# Patient Record
Sex: Male | Born: 1935 | Hispanic: Refuse to answer | Marital: Married | Smoking: Former smoker
Health system: Southern US, Community
[De-identification: ages and names within clinical notes are randomized; demographics above are authoritative.]

## PROBLEM LIST (undated history)

## (undated) DIAGNOSIS — Z8673 Personal history of transient ischemic attack (TIA), and cerebral infarction without residual deficits: Secondary | ICD-10-CM

## (undated) DIAGNOSIS — F0150 Vascular dementia without behavioral disturbance: Secondary | ICD-10-CM

## (undated) DIAGNOSIS — S51019A Laceration without foreign body of unspecified elbow, initial encounter: Secondary | ICD-10-CM

## (undated) DIAGNOSIS — I480 Paroxysmal atrial fibrillation: Secondary | ICD-10-CM

## (undated) DIAGNOSIS — I2510 Atherosclerotic heart disease of native coronary artery without angina pectoris: Secondary | ICD-10-CM

## (undated) DIAGNOSIS — I10 Essential (primary) hypertension: Secondary | ICD-10-CM

## (undated) DIAGNOSIS — S2239XA Fracture of one rib, unspecified side, initial encounter for closed fracture: Secondary | ICD-10-CM

---

## 2017-12-11 ENCOUNTER — Other Ambulatory Visit: Payer: Self-pay

## 2017-12-11 ENCOUNTER — Inpatient Hospital Stay (HOSPITAL_COMMUNITY)
Admission: AD | Admit: 2017-12-11 | Discharge: 2017-12-15 | DRG: 200 | Disposition: A | Discharge status: Skilled Nursing Facility | Payer: Medicare Other | Source: Other Acute Inpatient Hospital | Attending: Internal Medicine | Admitting: Internal Medicine

## 2017-12-11 ENCOUNTER — Encounter (HOSPITAL_COMMUNITY): Payer: Self-pay | Admitting: Obstetrics and Gynecology

## 2017-12-11 DIAGNOSIS — Y92009 Unspecified place in unspecified non-institutional (private) residence as the place of occurrence of the external cause: Secondary | ICD-10-CM | POA: Diagnosis not present

## 2017-12-11 DIAGNOSIS — Z8673 Personal history of transient ischemic attack (TIA), and cerebral infarction without residual deficits: Secondary | ICD-10-CM

## 2017-12-11 DIAGNOSIS — S51012A Laceration without foreign body of left elbow, initial encounter: Secondary | ICD-10-CM | POA: Diagnosis present

## 2017-12-11 DIAGNOSIS — Z880 Allergy status to penicillin: Secondary | ICD-10-CM

## 2017-12-11 DIAGNOSIS — I480 Paroxysmal atrial fibrillation: Secondary | ICD-10-CM | POA: Diagnosis present

## 2017-12-11 DIAGNOSIS — F0150 Vascular dementia without behavioral disturbance: Secondary | ICD-10-CM

## 2017-12-11 DIAGNOSIS — S2242XA Multiple fractures of ribs, left side, initial encounter for closed fracture: Secondary | ICD-10-CM | POA: Diagnosis present

## 2017-12-11 DIAGNOSIS — K2270 Barrett's esophagus without dysplasia: Secondary | ICD-10-CM | POA: Diagnosis present

## 2017-12-11 DIAGNOSIS — S2239XA Fracture of one rib, unspecified side, initial encounter for closed fracture: Secondary | ICD-10-CM

## 2017-12-11 DIAGNOSIS — Z87891 Personal history of nicotine dependence: Secondary | ICD-10-CM | POA: Diagnosis not present

## 2017-12-11 DIAGNOSIS — N179 Acute kidney failure, unspecified: Secondary | ICD-10-CM | POA: Diagnosis present

## 2017-12-11 DIAGNOSIS — Z888 Allergy status to other drugs, medicaments and biological substances status: Secondary | ICD-10-CM

## 2017-12-11 DIAGNOSIS — S2249XA Multiple fractures of ribs, unspecified side, initial encounter for closed fracture: Secondary | ICD-10-CM

## 2017-12-11 DIAGNOSIS — E876 Hypokalemia: Secondary | ICD-10-CM | POA: Diagnosis present

## 2017-12-11 DIAGNOSIS — R0781 Pleurodynia: Secondary | ICD-10-CM | POA: Diagnosis present

## 2017-12-11 DIAGNOSIS — Z955 Presence of coronary angioplasty implant and graft: Secondary | ICD-10-CM

## 2017-12-11 DIAGNOSIS — I10 Essential (primary) hypertension: Secondary | ICD-10-CM

## 2017-12-11 DIAGNOSIS — S271XXA Traumatic hemothorax, initial encounter: Principal | ICD-10-CM | POA: Diagnosis present

## 2017-12-11 DIAGNOSIS — E860 Dehydration: Secondary | ICD-10-CM | POA: Diagnosis present

## 2017-12-11 DIAGNOSIS — W19XXXA Unspecified fall, initial encounter: Secondary | ICD-10-CM | POA: Diagnosis present

## 2017-12-11 DIAGNOSIS — S51812A Laceration without foreign body of left forearm, initial encounter: Secondary | ICD-10-CM | POA: Diagnosis present

## 2017-12-11 DIAGNOSIS — I2510 Atherosclerotic heart disease of native coronary artery without angina pectoris: Secondary | ICD-10-CM

## 2017-12-11 DIAGNOSIS — J942 Hemothorax: Secondary | ICD-10-CM

## 2017-12-11 DIAGNOSIS — S51019A Laceration without foreign body of unspecified elbow, initial encounter: Secondary | ICD-10-CM

## 2017-12-11 DIAGNOSIS — K219 Gastro-esophageal reflux disease without esophagitis: Secondary | ICD-10-CM | POA: Diagnosis present

## 2017-12-11 DIAGNOSIS — R296 Repeated falls: Secondary | ICD-10-CM | POA: Diagnosis present

## 2017-12-11 HISTORY — DX: Paroxysmal atrial fibrillation: I48.0

## 2017-12-11 HISTORY — DX: Vascular dementia, unspecified severity, without behavioral disturbance, psychotic disturbance, mood disturbance, and anxiety: F01.50

## 2017-12-11 HISTORY — DX: Fracture of one rib, unspecified side, initial encounter for closed fracture: S22.39XA

## 2017-12-11 HISTORY — DX: Atherosclerotic heart disease of native coronary artery without angina pectoris: I25.10

## 2017-12-11 HISTORY — DX: Vascular dementia without behavioral disturbance: F01.50

## 2017-12-11 HISTORY — DX: Essential (primary) hypertension: I10

## 2017-12-11 HISTORY — DX: Personal history of transient ischemic attack (TIA), and cerebral infarction without residual deficits: Z86.73

## 2017-12-11 HISTORY — DX: Laceration without foreign body of unspecified elbow, initial encounter: S51.019A

## 2017-12-11 HISTORY — DX: Multiple fractures of ribs, unspecified side, initial encounter for closed fracture: S22.49XA

## 2017-12-11 LAB — MRSA PCR SCREENING: MRSA by PCR: NEGATIVE

## 2017-12-11 MED ORDER — PANTOPRAZOLE SODIUM 40 MG PO TBEC
40.0000 mg | DELAYED_RELEASE_TABLET | Freq: Two times a day (BID) | ORAL | Status: DC
  Administered 2017-12-11 – 2017-12-15 (×8): 40 mg via ORAL
  Filled 2017-12-11 (×8): qty 1

## 2017-12-11 MED ORDER — VITAMIN D 1000 UNITS PO TABS
2000.0000 [IU] | ORAL_TABLET | Freq: Every day | ORAL | Status: DC
  Administered 2017-12-12 – 2017-12-15 (×4): 2000 [IU] via ORAL
  Filled 2017-12-11 (×5): qty 2

## 2017-12-11 MED ORDER — FOLIC ACID 1 MG PO TABS
1.0000 mg | ORAL_TABLET | Freq: Every day | ORAL | Status: DC
  Administered 2017-12-12 – 2017-12-15 (×4): 1 mg via ORAL
  Filled 2017-12-11 (×4): qty 1

## 2017-12-11 MED ORDER — ACETAMINOPHEN 325 MG PO TABS
650.0000 mg | ORAL_TABLET | Freq: Four times a day (QID) | ORAL | Status: DC
  Administered 2017-12-11 – 2017-12-15 (×13): 650 mg via ORAL
  Filled 2017-12-11 (×13): qty 2

## 2017-12-11 MED ORDER — ACETAMINOPHEN 650 MG RE SUPP: 650.0000 mg | Freq: Four times a day (QID) | RECTAL | Status: DC | PRN

## 2017-12-11 MED ORDER — POTASSIUM CHLORIDE CRYS ER 10 MEQ PO TBCR
10.0000 meq | EXTENDED_RELEASE_TABLET | Freq: Two times a day (BID) | ORAL | Status: DC
  Administered 2017-12-11 – 2017-12-15 (×8): 10 meq via ORAL
  Filled 2017-12-11 (×8): qty 1

## 2017-12-11 MED ORDER — TAB-A-VITE/IRON PO TABS
1.0000 | ORAL_TABLET | Freq: Every day | ORAL | Status: DC
  Administered 2017-12-12 – 2017-12-15 (×3): 1 via ORAL
  Filled 2017-12-11 (×4): qty 1

## 2017-12-11 MED ORDER — SODIUM CHLORIDE 0.9 % IV SOLN: 250.0000 mL | INTRAVENOUS | Status: DC | PRN

## 2017-12-11 MED ORDER — HYDROMORPHONE HCL 1 MG/ML IJ SOLN
0.5000 mg | INTRAMUSCULAR | Status: DC | PRN
  Administered 2017-12-13: 0.5 mg via INTRAVENOUS
  Filled 2017-12-11: qty 1

## 2017-12-11 MED ORDER — IBUPROFEN 200 MG PO TABS
600.0000 mg | ORAL_TABLET | Freq: Four times a day (QID) | ORAL | Status: DC | PRN
  Administered 2017-12-13: 600 mg via ORAL
  Filled 2017-12-11: qty 3

## 2017-12-11 MED ORDER — TRAMADOL HCL 50 MG PO TABS
50.0000 mg | ORAL_TABLET | Freq: Four times a day (QID) | ORAL | Status: DC | PRN
  Administered 2017-12-11 – 2017-12-12 (×2): 50 mg via ORAL
  Filled 2017-12-11 (×2): qty 1

## 2017-12-11 MED ORDER — ACETAMINOPHEN 325 MG PO TABS: 650.0000 mg | ORAL_TABLET | Freq: Four times a day (QID) | ORAL | Status: DC | PRN

## 2017-12-11 MED ORDER — SODIUM CHLORIDE 0.9% FLUSH: 3.0000 mL | INTRAVENOUS | Status: DC | PRN

## 2017-12-11 MED ORDER — SODIUM CHLORIDE 0.9% FLUSH
3.0000 mL | Freq: Two times a day (BID) | INTRAVENOUS | Status: DC
  Administered 2017-12-11 – 2017-12-13 (×4): 3 mL via INTRAVENOUS

## 2017-12-11 MED ORDER — CITALOPRAM HYDROBROMIDE 10 MG PO TABS
10.0000 mg | ORAL_TABLET | Freq: Every day | ORAL | Status: DC
  Administered 2017-12-12 – 2017-12-15 (×4): 10 mg via ORAL
  Filled 2017-12-11 (×4): qty 1

## 2017-12-11 NOTE — H&P (Signed)
History and Physical    Matthew JacksonWilliam Santy ZOX:[id number] DOB: 07/03/1930 DOA: 12/11/2017  PCP: No primary care provider on file.  Patient coming from: home by way of [place]   Chief Complaint: fall  HPI: Matthew JacksonWilliam Pacheco is a 84 y.o. male with medical history significant for vascular dementia (lives at home w/ wife), CVA (not recent), CAD (stented approximately 12 years ago), paroxysmal atrial fibrillation, barrett's esophagus, who presents with above.  Unwitnessed fall at home. Walks with a walker. Had fallen several weeks ago as well and had skin tears on left arm that were re-injured. Wife went to see immediately after, patient alert but confused, complaining of left lateral rib pain. No recent illnesses, has been in usual state of health, but is quite unstable on feet at baseline. Has not complained of fever or chest pain. Normal PO, no vomiting or diarrhea. History is from wife and son; patient has no complaints but unable to provide history.  ED Course: CT chest/abdomen/pelvis. Trauma surg consult. Vitals reportedly wnl and stable. Labs sig for k 3.3, gluc 148, normal hemoglobin. Ct head  Review of Systems: As per HPI otherwise 10 point review of systems negative.    Past Medical History:  Diagnosis Date  . AF (paroxysmal atrial fibrillation) (HCC) 12/11/2017  . Coronary artery disease 12/11/2017  . Essential hypertension 12/11/2017  . History of CVA in adulthood 12/11/2017  . Rib fractures 12/11/2017  . Skin tear of elbow without complication 12/11/2017  . Vascular dementia 12/11/2017     has no tobacco, alcohol, and drug history on file.  Allergies not on file  No family history on file.  Prior to Admission medications   Not on File    Physical Exam: Vitals:   12/11/17 1753  BP: (!) 149/56  Pulse: 62  Resp: 17  Temp: (!) 97.5 F (36.4 C)  TempSrc: Oral  SpO2: 90%  Weight: 78.5 kg  Height: 5\' 9"  (1.753 m)    Constitutional: No acute distress Head:  Atraumatic Eyes: Conjunctiva clear ENM: Moist mucous membranes. Poor dentition.  Neck: d Respiratory: Clear to auscultation bilaterally save for mild rales at both bases, no wheezing/rales/rhonchi. Decreased respiratory effort. No accessory muscle use. . Cardiovascular: Regular rate and rhythm. Mod systolic murmur. Abdomen: Non-tender, non-distended. No masses. No rebound or guarding. Positive bowel sounds. Musculoskeletal: No joint deformity upper and lower extremities. Normal ROM, no contractures. Normal muscle tone. Able to flex legs without pain Skin: superficial skin tear left forearm and elbow. Dressing on low back not removed. echymosis left lateral lower ribs Extremities: trace peripheral edema. Palpable peripheral pulses. Neurologic: Alert, moving all 4 extremities. Oriented to person, not to time or place Psychiatric: speech limited but fluent.   Labs on Admission: I have personally reviewed following labs and imaging studies  CBC: No results for input(s): WBC, NEUTROABS, HGB, HCT, MCV, PLT in the last 168 hours. Basic Metabolic Panel: No results for input(s): NA, K, CL, CO2, GLUCOSE, BUN, CREATININE, CALCIUM, MG, PHOS in the last 168 hours. GFR: CrCl cannot be calculated (No successful lab value found.). Liver Function Tests: No results for input(s): AST, ALT, ALKPHOS, BILITOT, PROT, ALBUMIN in the last 168 hours. No results for input(s): LIPASE, AMYLASE in the last 168 hours. No results for input(s): AMMONIA in the last 168 hours. Coagulation Profile: No results for input(s): INR, PROTIME in the last 168 hours. Cardiac Enzymes: No results for input(s): CKTOTAL, CKMB, CKMBINDEX, TROPONINI in the last 168 hours. BNP (last 3 results) No results  for input(s): PROBNP in the last 8760 hours. HbA1C: No results for input(s): HGBA1C in the last 72 hours. CBG: No results for input(s): GLUCAP in the last 168 hours. Lipid Profile: No results for input(s): CHOL, HDL, LDLCALC,  TRIG, CHOLHDL, LDLDIRECT in the last 72 hours. Thyroid Function Tests: No results for input(s): TSH, T4TOTAL, FREET4, T3FREE, THYROIDAB in the last 72 hours. Anemia Panel: No results for input(s): VITAMINB12, FOLATE, FERRITIN, TIBC, IRON, RETICCTPCT in the last 72 hours. Urine analysis: No results found for: COLORURINE, APPEARANCEUR, LABSPEC, PHURINE, GLUCOSEU, HGBUR, BILIRUBINUR, KETONESUR, PROTEINUR, UROBILINOGEN, NITRITE, LEUKOCYTESUR  Radiological Exams on Admission: No results found.  EKG: Independently reviewed. Frequent pvcs. nsr.  Assessment/Plan Principal Problem:   Rib fractures Active Problems:   Essential hypertension   Coronary artery disease   Vascular dementia   History of CVA in adulthood   AF (paroxysmal atrial fibrillation) (HCC)   Skin tear of elbow without complication   # Rib fractures - left ribs 7-12, partially displaced, with ct showing small L hemothorax vs hematoma. Breathing comfortably, hemodynamically stable. Not complaining of pain. Trauma surg consulted (dr. Andrey Campanile) and will see. - monitor o2 with vitals - pain control: tylenol standing, toradol prn for mod pain, dilaudid prn for severe pain - incentive spirometry - appreciate trauma surg recs - step-down for now  # Superficial skin lacerations - skin tears to left forearm and left elbow. I initially supposed dressing lower back was for pressure ulcer but family says from superficial laceration from today's fall.  - wound care, will need to more fully evaluate low back laceration  # HTN - here bp wnl, not on meds at home - ctm  # paroxysmal a-fib - nsr here. Not on a rate control agent. - holding aspirin plavix given possible acute bleed  # CAD - s/p stents ~2 years ago, on dapt - hold as above  # GERD - substitute pantoprazole  # hypokalemia - k 3.3 - cont home kcl - cmp in am, with mg  # vascular dementia   DVT prophylaxis: scds, holding pharmacologic given acute possible  bleeding Code Status: full code, confirmed w/ family  Family Communication: son raney Armijo [phone]  Disposition Plan: tbd  Consults called: trauma surg [facility]  Admission status: step-down    Silvano Bilis MD [facility] Pager [phone]  If 7PM-7AM, please contact night-coverage [email or web address] Password [facility]  12/11/2017, 7:24 PM

## 2017-12-11 NOTE — Plan of Care (Signed)
[facility] [place] [name] is a 84 year old male with past medical history significant for HTN, CAD, A. Fib, dementia; who presented after falling and tripping at home.  Patient found to have multiple rib fractures with signs of a small hematoma or hemothorax found on imaging.  Patient currently on medications of Plavix.  Vital signs noted to be stable.  Initial labs revealed WBC 12.6, hemoglobin 15, platelets 180, sodium 140, potassium 3.3, chloride 107, CO2 25, BUN 25, creatinine 0.86, glucose 148.  Dr. Derrell Pacheco of trauma surgery was consulted.  TR[facility] called to admit.  Accepted to a telemetry bed as observation.

## 2017-12-11 NOTE — Consult Note (Addendum)
Reason for Consult:fall, rib fxs Referring Physician: dr Trisha Mangle is an 84 y.o. male.  HPI: 84 year old Caucasian male with a past medical history for vascular dementia, remote history of CVA, coronary artery disease, paroxysmal atrial fibrillation fell this morning while at home with his wife.  It was an unwitnessed fall.  He walks generally with a walker.  He had fallen several weeks ago and had some skin tears on his left arm which had almost healed which reopened after the fall this morning.  He was initially taken to [facility] and evaluated with labs and CT imaging.  [facility] did not have beds available so we were contacted to accept patient in transfer.  On plavix  At outside hospital he was found to have some mild hypokalemia with potassium 3.3.  Blood glucose 148.  Otherwise labs unremarkable except for mild leukocytosis with a white count of 12.6.  CT lumbar spine showed no evidence of fracture to the thoracic or lumbar spine. CT chest abdomen pelvis with contrast showed nondisplaced partially displaced left seventh through 12th rib fractures; small left hemothorax versus extrapleural chest wall hematoma. CT C-spine showed no evidence of acute fracture but did have chronic changes and loss of normal lordosis.  CT head showed no acute abnormalities just chronic changes.  He complains of left anterior lateral rib pain.  He denies any neck pain, shortness of breath, abdominal pain, extremity pain other than the area where the skin tears are.  He denies any blurry vision.  He denies any trouble breathing.  He has been feeling well per the family without fever or poor oral intake.  Past Medical History:  Diagnosis Date  . AF (paroxysmal atrial fibrillation) (HCC) 12/11/2017  . Coronary artery disease 12/11/2017  . Essential hypertension 12/11/2017  . History of CVA in adulthood 12/11/2017  . Rib fractures 12/11/2017  . Skin tear of elbow without  complication 12/11/2017  . Vascular dementia 12/11/2017      No family history on file.  Social History:  reports that he quit smoking about 57 years ago. His smoking use included cigarettes. He has never used smokeless tobacco. He reports that he does not drink alcohol or use drugs.  Allergies:  Allergies  Allergen Reactions  . Penicillins   . Phenobarbital     Medications: I have reviewed the patient's current medications.  No results found for this or any previous visit (from the past 48 hour(s)).  No results found.  Review of Systems  Unable to perform ROS: Dementia   Blood pressure (!) 149/56, pulse 62, temperature (!) 97.5 F (36.4 C), temperature source Oral, resp. rate 17, height 5\' 9"  (1.753 m), weight 78.5 kg, SpO2 90 %. Physical Exam  Vitals reviewed. Constitutional: He appears well-developed and well-nourished. He is cooperative. No distress. Cervical collar and nasal cannula in place.  HENT:  Head: Normocephalic and atraumatic. Head is without raccoon's eyes, without Battle's sign, without abrasion, without contusion and without laceration.  Right Ear: Hearing, tympanic membrane, external ear and ear canal normal. No lacerations. No drainage or tenderness. No foreign bodies. Tympanic membrane is not perforated. No hemotympanum.  Left Ear: Hearing, tympanic membrane, external ear and ear canal normal. No lacerations. No drainage or tenderness. No foreign bodies. Tympanic membrane is not perforated. No hemotympanum.  Nose: Nose normal. No nose lacerations, sinus tenderness, nasal deformity or nasal septal hematoma. No epistaxis.  Mouth/Throat: Uvula is midline, oropharynx is clear and moist and mucous membranes  are normal. No lacerations.  Eyes: Pupils are equal, round, and reactive to light. Conjunctivae, EOM and lids are normal. No scleral icterus.  Neck: Trachea normal. Neck supple. No JVD present. No spinous process tenderness and no muscular tenderness present.  Carotid bruit is not present. No tracheal deviation present. No thyromegaly present.  Cardiovascular: Normal rate, regular rhythm, normal heart sounds, intact distal pulses and normal pulses.  Respiratory: Effort normal and breath sounds normal. No respiratory distress. He exhibits tenderness. He exhibits no bony tenderness, no laceration and no crepitus.    GI: Soft. Normal appearance. He exhibits no distension. Bowel sounds are decreased. There is no tenderness. There is no rigidity, no rebound, no guarding and no CVA tenderness.  Musculoskeletal: Normal range of motion. He exhibits no edema or tenderness.  Lymphadenopathy:    He has no cervical adenopathy.  Neurological: He is alert. He has normal strength. No cranial nerve deficit or sensory deficit. GCS eye subscore is 4. GCS verbal subscore is 4. GCS motor subscore is 6.  Awake, conversive; not oriented to year/month; oriented to person.   Skin: Skin is warm and dry. Abrasion and ecchymosis noted. He is not diaphoretic.  Various states of healing ecchymosis on b/l ue; some skin tears on L forearm/elbow  Psychiatric: He has a normal mood and affect. His speech is normal and behavior is normal. He exhibits abnormal recent memory.    Assessment/Plan: Status post fall Left rib fractures 7 through 12 Small left hemothorax Multiple skin tears Vascular dementia Hypertension History of coronary artery disease Remote history of CVA  Agree with stepdown status Repeat chest x-ray in morning Check labs in morning Pulmonary toilet- incentive spirometer, flutter valve.  I am not sure if his dementia will enable him to do incentive spirometer Analgesia for rib fractures-we will try to avoid opioids in order to prevent worsening mental status.  We will try scheduled Tylenol.  PRN ibuprofen  Hold oral anticoagulation. Can probably start chemical VTE prophylaxis in the morning SCDs PT-OT consult in morning  Mary SellaEric M. Andrey CampanileWilson, MD, FACS General,  Bariatric, & Minimally Invasive Surgery [facility]   Gaynelle Aduric Marvis Bakken 12/11/2017, 9:09 PM

## 2017-12-12 ENCOUNTER — Inpatient Hospital Stay (HOSPITAL_COMMUNITY): Payer: Medicare Other

## 2017-12-12 DIAGNOSIS — S2242XD Multiple fractures of ribs, left side, subsequent encounter for fracture with routine healing: Secondary | ICD-10-CM

## 2017-12-12 DIAGNOSIS — J942 Hemothorax: Secondary | ICD-10-CM

## 2017-12-12 LAB — CBC
HCT: 40.9 % (ref 39.0–52.0)
HEMOGLOBIN: 13.9 g/dL (ref 13.0–17.0)
MCH: 33.3 pg (ref 26.0–34.0)
MCHC: 34 g/dL (ref 30.0–36.0)
MCV: 98.1 fL (ref 78.0–100.0)
Platelets: 171 10*3/uL (ref 150–400)
RBC: 4.17 MIL/uL — ABNORMAL LOW (ref 4.22–5.81)
RDW: 13 % (ref 11.5–15.5)
WBC: 12.5 10*3/uL — AB (ref 4.0–10.5)

## 2017-12-12 LAB — COMPREHENSIVE METABOLIC PANEL
ALBUMIN: 3.2 g/dL — AB (ref 3.5–5.0)
ALK PHOS: 50 U/L (ref 38–126)
ALT: 20 U/L (ref 0–44)
ANION GAP: 5 (ref 5–15)
AST: 21 U/L (ref 15–41)
BILIRUBIN TOTAL: 1.5 mg/dL — AB (ref 0.3–1.2)
BUN: 30 mg/dL — ABNORMAL HIGH (ref 8–23)
CALCIUM: 9 mg/dL (ref 8.9–10.3)
CO2: 30 mmol/L (ref 22–32)
Chloride: 104 mmol/L (ref 98–111)
Creatinine, Ser: 1.25 mg/dL — ABNORMAL HIGH (ref 0.61–1.24)
GFR calc Af Amer: 59 mL/min — ABNORMAL LOW (ref >60)
GFR, EST NON AFRICAN AMERICAN: 51 mL/min — AB (ref >60)
GLUCOSE: 116 mg/dL — AB (ref 70–99)
Potassium: 4.2 mmol/L (ref 3.5–5.1)
Sodium: 139 mmol/L (ref 135–145)
TOTAL PROTEIN: 5.2 g/dL — AB (ref 6.5–8.1)

## 2017-12-12 LAB — URINALYSIS, ROUTINE W REFLEX MICROSCOPIC
BILIRUBIN URINE: NEGATIVE
Glucose, UA: NEGATIVE mg/dL
Hgb urine dipstick: NEGATIVE
KETONES UR: NEGATIVE mg/dL
Leukocytes, UA: NEGATIVE
NITRITE: NEGATIVE
PH: 5 (ref 5.0–8.0)
Protein, ur: NEGATIVE mg/dL
Specific Gravity, Urine: >1.046 — ABNORMAL HIGH (ref 1.005–1.030)

## 2017-12-12 LAB — PROTIME-INR
INR: 1.07
PROTHROMBIN TIME: 13.8 s (ref 11.4–15.2)

## 2017-12-12 MED ORDER — ENOXAPARIN SODIUM 40 MG/0.4ML ~~LOC~~ SOLN
40.0000 mg | SUBCUTANEOUS | Status: DC
  Administered 2017-12-12 – 2017-12-15 (×4): 40 mg via SUBCUTANEOUS
  Filled 2017-12-12 (×4): qty 0.4

## 2017-12-12 MED ORDER — SODIUM CHLORIDE 0.9 % IV BOLUS
500.0000 mL | Freq: Once | INTRAVENOUS | Status: AC
  Administered 2017-12-12: 500 mL via INTRAVENOUS

## 2017-12-12 MED ORDER — SODIUM CHLORIDE 0.9 % IV SOLN
250.0000 mL | INTRAVENOUS | Status: DC | PRN
  Administered 2017-12-12: 250 mL via INTRAVENOUS

## 2017-12-12 MED ORDER — BOOST PLUS PO LIQD
237.0000 mL | Freq: Three times a day (TID) | ORAL | Status: DC
  Administered 2017-12-12 – 2017-12-15 (×4): 237 mL via ORAL
  Filled 2017-12-12 (×13): qty 237

## 2017-12-12 NOTE — Evaluation (Signed)
Occupational Therapy Evaluation Patient Details Name: Matthew Pacheco MRN: [id number] DOB: [birth year] Today's Date: 12/12/2017    History of Present Illness 84 y.o. male admitted on 12/11/17 for fall with L rib fractures 7-12 and small L hemothorax vs hematima.  Pt also with superficial skin lacerations/tears on his left forearm and elbow.  Pt with other significant PMH of vascular dementia, CVA, essential HTN, CAD, and PAF.   Clinical Impression   PTA, pt was able to ambulate with RW without assistance and had assistance from wife and home care nurse for dressing and bathing tasks. He currently is limited by generalized weakness and rib pain. He requires mod assist to power up to standing with RW to attempt to utilize urinal, mod assist for LB ADL, and min assist for UB ADL. Pt's wife present and supportive during session but unsure of her ability to provide assistance at pt's current functional level. Pt would benefit from continued OT services while admitted to improve independence and safety with ADL and functional mobility. Recommend SNF level rehabilitation post-acute D/C to maximize functional participation in ADL. OT will continue to follow while admitted.     Follow Up Recommendations  SNF;Supervision/Assistance - 24 hour    Equipment Recommendations  Other (comment)(defer to next venue of care)    Recommendations for Other Services       Precautions / Restrictions Precautions Precautions: Fall Precaution Comments: recent h/o falls Restrictions Weight Bearing Restrictions: No      Mobility Bed Mobility Overal bed mobility: Needs Assistance Bed Mobility: Supine to Sit     Supine to sit: Min assist     General bed mobility comments: OOB in recliner on my arrival.   Transfers Overall transfer level: Needs assistance Equipment used: Rolling walker (2 wheeled) Transfers: Sit to/from Stand Sit to Stand: Mod assist         General transfer comment: Mod assist to  power up to standing with significant posterior lean.     Balance Overall balance assessment: Needs assistance Sitting-balance support: Feet supported;No upper extremity supported Sitting balance-Leahy Scale: Fair Sitting balance - Comments: seated at edge of chair Postural control: Posterior lean Standing balance support: Bilateral upper extremity supported;During functional activity Standing balance-Leahy Scale: Poor Standing balance comment: Mod assist wtih significant posterior lean in standing with BUE support.                            ADL either performed or assessed with clinical judgement   ADL Overall ADL's : Needs assistance/impaired Eating/Feeding: Sitting;Minimal assistance   Grooming: Sitting;Minimal assistance   Upper Body Bathing: Minimal assistance;Sitting   Lower Body Bathing: Moderate assistance;Sit to/from stand   Upper Body Dressing : Minimal assistance;Sitting   Lower Body Dressing: Moderate assistance;Sit to/from Market researcher Details (indicate cue type and reason): Able to complete power up to standing in preparation for ADL transfers with mod assist. Unable to progress with mobility due to posterior bias. Attempting to stand with assistance to urinate with RW.  Toileting- Clothing Manipulation and Hygiene: Maximal assistance;Sit to/from stand Toileting - Clothing Manipulation Details (indicate cue type and reason): wife and OT assisting pt to use urinal; unable to urinate       General ADL Comments: Pt limited by rib pain and generalized weakness. He reports "I'm stiff."     Vision Patient Visual Report: No change from baseline Vision Assessment?: No apparent visual deficits     Perception  Praxis      Pertinent Vitals/Pain Pain Assessment: Faces Faces Pain Scale: Hurts even more Pain Location: left side with mobility Pain Descriptors / Indicators: Guarding;Grimacing Pain Intervention(s): Limited activity within  patient's tolerance;Monitored during session;Repositioned     Hand Dominance     Extremity/Trunk Assessment Upper Extremity Assessment Upper Extremity Assessment: Generalized weakness   Lower Extremity Assessment Lower Extremity Assessment: Generalized weakness   Cervical / Trunk Assessment Cervical / Trunk Assessment: Kyphotic   Communication Communication Communication: No difficulties   Cognition Arousal/Alertness: Awake/alert Behavior During Therapy: WFL for tasks assessed/performed Overall Cognitive Status: History of cognitive impairments - at baseline                                 General Comments: History of vascular dementia. Oriented to self although reporting that he was "Matthew Pacheco." and wife corrected him that he is "Pacheco."   General Comments  VSS throughout session.     Exercises     Shoulder Instructions      Home Living Family/patient expects to be discharged to:: Private residence Living Arrangements: Spouse/significant other Available Help at Discharge: Family;Personal care attendant(wife but she also has memory deficits; nurse during the day) Type of Home: House             Bathroom Shower/Tub: Producer, television/film/videoWalk-in shower   Bathroom Toilet: Standard     Home Equipment: Shower seat   Additional Comments: Need to confirm if walk-in shower.       Prior Functioning/Environment Level of Independence: Needs assistance  Gait / Transfers Assistance Needed: Ambulates with RW without assistance.  ADL's / Homemaking Assistance Needed: Requires assistance from wife or nurse for ADL tasks.    Comments: Has nurse that comes in during the day but only wife present at night.         OT Problem List: Decreased strength;Decreased range of motion;Decreased activity tolerance;Impaired balance (sitting and/or standing);Decreased safety awareness;Decreased knowledge of use of DME or AE;Decreased knowledge of precautions;Pain;Decreased cognition      OT  Treatment/Interventions: Self-care/ADL training;Therapeutic exercise;Energy conservation;DME and/or AE instruction;Therapeutic activities;Patient/family education;Balance training;Cognitive remediation/compensation    OT Goals(Current goals can be found in the care plan section) Acute Rehab OT Goals Patient Stated Goal: unable to state OT Goal Formulation: With family(with wife) Time For Goal Achievement: 12/26/17 Potential to Achieve Goals: Fair ADL Goals Pt Will Perform Eating: with modified independence;sitting Pt Will Perform Grooming: with min assist;standing Pt Will Perform Upper Body Dressing: with modified independence;sitting Pt Will Perform Lower Body Dressing: with supervision;sit to/from stand Pt Will Transfer to Toilet: with min guard assist;ambulating;bedside commode Pt Will Perform Toileting - Clothing Manipulation and hygiene: with min guard assist;sit to/from stand  OT Frequency: Min 2X/week   Barriers to D/C:            Co-evaluation              AM-PAC PT "6 Clicks" Daily Activity     Outcome Measure Help from another person eating meals?: A Little Help from another person taking care of personal grooming?: A Little Help from another person toileting, which includes using toliet, bedpan, or urinal?: A Lot Help from another person bathing (including washing, rinsing, drying)?: A Lot Help from another person to put on and taking off regular upper body clothing?: A Little Help from another person to put on and taking off regular lower body clothing?: A Lot 6 Click Score: 15  End of Session Equipment Utilized During Treatment: Gait belt;Rolling walker Nurse Communication: Mobility status  Activity Tolerance: Patient tolerated treatment well Patient left: in chair;with call bell/phone within reach;with bed alarm set;with family/visitor present  OT Visit Diagnosis: Other abnormalities of gait and mobility (R26.89);Other symptoms and signs involving cognitive  function;Muscle weakness (generalized) (M62.81)                Time: 1610-9604 OT Time Calculation (min): 28 min Charges:  OT General Charges $OT Visit: 1 Visit OT Evaluation $OT Eval Moderate Complexity: 1 Mod OT Treatments $Self Care/Home Management : 8-22 mins  Doristine Section, MS OTR/L  Pager: [phone]   Kennya Schwenn A Luby Seamans 12/12/2017, 4:37 PM

## 2017-12-12 NOTE — Evaluation (Signed)
Physical Therapy Evaluation Patient Details Name: Matthew Pacheco MRN: [id number] DOB: 1930/07/25 Today's Date: 12/12/2017   History of Present Illness  84 y.o. male admitted on 12/11/17 for fall with L rib fractures 7-12 and small L hemothorax vs hematima.  Pt also with superficial skin lacerations/tears on his left forearm and elbow.  Pt with other significant PMH of vascular dementia, CVA, essential HTN, CAD, and PAF.  Clinical Impression  It took significant effort to stand and take pivotal steps with the RW to the chair.  Pt with significant posterior lean throughout and needed mod assist with RW.  O2 sats were stable on RA during the session, but I returned [name] O2 to his nose at the end of the session for safety.  Pt would benefit from post acute rehab at discharge.    Follow Up Recommendations SNF    Equipment Recommendations  None recommended by PT    Recommendations for Other Services   NA    Precautions / Restrictions Precautions Precautions: Fall Precaution Comments: recent h/o falls      Mobility  Bed Mobility Overal bed mobility: Needs Assistance Bed Mobility: Supine to Sit     Supine to sit: Min assist     General bed mobility comments: Min assist to support trunk during transitions and help to progress bil legs over EOB.   Transfers Overall transfer level: Needs assistance Equipment used: Rolling walker (2 wheeled) Transfers: Sit to/from Stand Sit to Stand: Mod assist;From elevated surface         General transfer comment: Mod assist to support trunk to power up to stand from elevated bed.  Pt with posterior lean in standing.   Ambulation/Gait Ambulation/Gait assistance: Mod assist Gait Distance (Feet): 3 Feet Assistive device: Rolling walker (2 wheeled) Gait Pattern/deviations: Step-to pattern;Shuffle;Leaning posteriorly     General Gait Details: Pt leaning posteriorly throughout attempts at gait to the chair just beside the bed.  He took a few  pivotal steps and then "plopped" into the chair falling backwards.          Balance Overall balance assessment: Needs assistance Sitting-balance support: Feet supported;No upper extremity supported Sitting balance-Leahy Scale: Poor Sitting balance - Comments: min assist EOB Postural control: Posterior lean Standing balance support: Bilateral upper extremity supported Standing balance-Leahy Scale: Poor Standing balance comment: mod assist with RW.                              Pertinent Vitals/Pain Pain Assessment: Faces Faces Pain Scale: Hurts even more Pain Location: left side Pain Descriptors / Indicators: Guarding;Grimacing Pain Intervention(s): Limited activity within patient's tolerance;Monitored during session;Repositioned    Home Living Family/patient expects to be discharged to:: Private residence Living Arrangements: Spouse/significant other               Additional Comments: pt unable to provide accurate hx    Prior Function Level of Independence: Independent with assistive device(s)         Comments: per chart pt ambulated with RW, but has also been falling a lot recently.         Extremity/Trunk Assessment   Upper Extremity Assessment Upper Extremity Assessment: Defer to OT evaluation    Lower Extremity Assessment Lower Extremity Assessment: Generalized weakness    Cervical / Trunk Assessment Cervical / Trunk Assessment: Kyphotic  Communication   Communication: No difficulties  Cognition Arousal/Alertness: Awake/alert Behavior During Therapy: WFL for tasks assessed/performed Overall Cognitive Status: No  family/caregiver present to determine baseline cognitive functioning                                 General Comments: h/o vascular dementia, pleasantly confused.      General Comments General comments (skin integrity, edema, etc.): VSS throughout and pt was on RA during mobility.          Assessment/Plan     PT Assessment Patient needs continued PT services  PT Problem List Decreased activity tolerance;Decreased balance;Decreased mobility;Decreased cognition;Decreased safety awareness;Decreased knowledge of use of DME;Decreased coordination;Decreased knowledge of precautions;Pain       PT Treatment Interventions Gait training;DME instruction;Stair training;Functional mobility training;Therapeutic activities;Therapeutic exercise;Balance training;Neuromuscular re-education;Cognitive remediation;Patient/family education    PT Goals (Current goals can be found in the Care Plan section)  Acute Rehab PT Goals Patient Stated Goal: unable to state PT Goal Formulation: Patient unable to participate in goal setting Time For Goal Achievement: 12/26/17 Potential to Achieve Goals: Good    Frequency Min 2X/week   Barriers to discharge   Pt lives with an elderly wife and has recent history of frequent falls       AM-PAC PT "6 Clicks" Daily Activity  Outcome Measure Difficulty turning over in bed (including adjusting bedclothes, sheets and blankets)?: Unable Difficulty moving from lying on back to sitting on the side of the bed? : Unable Difficulty sitting down on and standing up from a chair with arms (e.g., wheelchair, bedside commode, etc,.)?: Unable Help needed moving to and from a bed to chair (including a wheelchair)?: A Lot Help needed walking in hospital room?: A Lot Help needed climbing 3-5 steps with a railing? : Total 6 Click Score: 8    End of Session Equipment Utilized During Treatment: Gait belt Activity Tolerance: Patient limited by pain Patient left: in chair;with call bell/phone within reach;with chair alarm set Nurse Communication: Mobility status PT Visit Diagnosis: Muscle weakness (generalized) (M62.81);Repeated falls (R29.6);Difficulty in walking, not elsewhere classified (R26.2)    Time: [id number] PT Time Calculation (min) (ACUTE ONLY): 14 min   Charges:           Lurena Joinerebecca B. Ieshia Hatcher, PT, DPT [phone]   PT Evaluation $PT Eval Moderate Complexity: 1 Mod         12/12/2017, 4:00 PM

## 2017-12-12 NOTE — Progress Notes (Signed)
[facility] Progress Note     Subjective: CC:  Denies pain. No IS at bedside. Pleasantly demented.   Objective: Vital signs in last 24 hours: Temp:  [97.5 F (36.4 C)-98.5 F (36.9 C)] 98.5 F (36.9 C) (08/24 0831) Pulse Rate:  [31-75] 31 (08/24 0400) Resp:  [15-18] 15 (08/24 0400) BP: (120-149)/(47-95) 120/47 (08/24 0400) SpO2:  [90 %-95 %] 95 % (08/24 0400) Weight:  [78.5 kg] 78.5 kg (08/23 1753) Last BM Date: 12/11/17  Intake/Output from previous day: 08/23 0701 - 08/24 0700 In: 90 [P.O.:90] Out: -  Intake/Output this shift: Total I/O In: 240 [P.O.:240] Out: -   PE: Gen:  Alert, NAD, pleasant Card:  Regular rate and rhythm, pedal pulses 2+ BL Pulm:  Normal effort, approp tender chest wall, clear to auscultation bilaterally Abd: Soft, non-tender, non-distended LUE: 2 skin tears, clean and dry, wrapped in bandage Skin: warm and dry, no rashes  Psych: A&Ox3   Lab Results:  Recent Labs    12/12/17 0307  WBC 12.5*  HGB 13.9  HCT 40.9  PLT 171   BMET Recent Labs    12/12/17 0307  NA 139  K 4.2  CL 104  CO2 30  GLUCOSE 116*  BUN 30*  CREATININE 1.25*  CALCIUM 9.0   PT/INR Recent Labs    12/12/17 0307  LABPROT 13.8  INR 1.07   CMP     Component Value Date/Time   NA 139 12/12/2017 0307   K 4.2 12/12/2017 0307   CL 104 12/12/2017 0307   CO2 30 12/12/2017 0307   GLUCOSE 116 (H) 12/12/2017 0307   BUN 30 (H) 12/12/2017 0307   CREATININE 1.25 (H) 12/12/2017 0307   CALCIUM 9.0 12/12/2017 0307   PROT 5.2 (L) 12/12/2017 0307   ALBUMIN 3.2 (L) 12/12/2017 0307   AST 21 12/12/2017 0307   ALT 20 12/12/2017 0307   ALKPHOS 50 12/12/2017 0307   BILITOT 1.5 (H) 12/12/2017 0307   GFRNONAA 51 (L) 12/12/2017 0307   GFRAA 59 (L) 12/12/2017 0307   Lipase  No results found for: LIPASE     Studies/Results: Dg Chest Port 1 View  Result Date: 12/12/2017 CLINICAL DATA:  Fall.  LEFT rib fractures. EXAM: PORTABLE CHEST 1 VIEW COMPARISON:   Outside studies are not available for comparison. FINDINGS: The cardiomediastinal silhouette is unremarkable. This is a mildly low volume film with mild bibasilar atelectasis. The known LEFT rib fractures are not well visualized on this radiograph. No pneumothorax or definite pleural effusion. IMPRESSION: Mildly low volume film with mild bibasilar atelectasis. Known LEFT rib fractures not well visualized on this radiograph. Electronically Signed   By: Harmon PierJeffrey  Hu M.D.   On: 12/12/2017 07:13    Anti-infectives: Anti-infectives (From admission, onward)   None     Assessment/Plan PMH CVA CAD on plavix and ASA - continue to hold anticoagulation  Paroxysmal a.fib  GERD Fall L Rib FX 7-12 - pain control, IS/pulm toilet  Small L HTX vs extrapleural hematoma - no PTX on follow up CXR  FEN - Reg diet, hypokalemia improved  ID - none VTE - SCD's, start lovenox today  Dispo - SDU    LOS: 1 day    Hosie SpangleElizabeth Simaan, [facility] Pager: [phone]

## 2017-12-12 NOTE — Progress Notes (Signed)
Initial Nutrition Assessment  DOCUMENTATION CODES:  Not applicable  INTERVENTION:  Continue Boost Supplements and MVI  Add Magic cup TID with meals, each supplement provides 290 kcal and 9 grams of protein  NUTRITION DIAGNOSIS:  Increased nutrient needs related to Fracture healing as evidenced by estimated nutritional requirements for this condition  GOAL:  Patient will meet greater than or equal to 90% of their needs  MONITOR:  PO intake, Supplement acceptance, Labs, Weight trends, Skin  REASON FOR ASSESSMENT:  Consult Assessment of nutrition requirement/status  ASSESSMENT:  84 y/o male PMHx vascular dementia, CVA, CAD, Afib, barretts esophagus, HTN, GERD. Presents after falling at home. Fractured ribs 7-12 and suffered small L hemothorax vs hematoma.   Pacheco consulted to assess nutritional status. Pt is demented and unable to offer any history. Wife is at bedside, but she also displays quite noticeable cognitive deficits/confusion as she repeatedly contradicts herself in consecutive sentences. Unable to discern exactly how patient was eating at home.   Per records, pt was weighed at 177 lbs a couple years ago. Presently, he was admitted at 173 lbs, suggesting he has been successfully at maintaining weight. Spouse did not he drinks Ensure at home and takes a vitamin.   At this time, nursing reports patient not eating well. He is fairly lethargic and only briefly opens eyes to verbal cueing. He already has Boost, MVI, folate ordered.   Physical Exam is unremarkable. No apparent muscle/fat wasting.   He is already on liberal diet. Suspect patients intake will improve as becomes more alert. Will add [employer] Cup.   Labs: Bg: 116, BUN/Creat:30/1.25, Albumin:3.2, WBC:12.5 Meds: MVI with min, KCL, PPI, Boost BID, Folate, IVF, PRN ultram.    Recent Labs  Lab 12/12/17 0307  NA 139  K 4.2  CL 104  CO2 30  BUN 30*  CREATININE 1.25*  CALCIUM 9.0  GLUCOSE 116*   NUTRITION - FOCUSED  PHYSICAL EXAM:   Most Recent Value  Orbital Region  Mild depletion  Upper Arm Region  No depletion  Thoracic and Lumbar Region  No depletion  Buccal Region  No depletion  Temple Region  No depletion  Clavicle Bone Region  No depletion  Clavicle and Acromion Bone Region  No depletion  Scapular Bone Region  No depletion  Dorsal Hand  No depletion  Patellar Region  No depletion  Anterior Thigh Region  No depletion  Posterior Calf Region  No depletion  Edema (Pacheco Assessment)  None  Hair  Reviewed  Eyes  Reviewed  Mouth  Reviewed  Skin  Reviewed  Nails  Reviewed     Diet Order:   Diet Order            Diet regular Room service appropriate? Yes; Fluid consistency: Thin  Diet effective now             EDUCATION NEEDS:  No education needs have been identified at this time  Skin: Skin tear to L arm  Last BM:  8/23  Height:  Ht Readings from Last 1 Encounters:  12/11/17 5\' 9"  (1.753 m)   Weight:  Wt Readings from Last 1 Encounters:  12/11/17 78.5 kg  Per Care Everywhere: Was 176 lbs 02/2016  Ideal Body Weight:  72.73 kg  BMI:  Body mass index is 25.56 kg/m.  Estimated Nutritional Needs:  Kcal:  1950-2100 kcals (25-27 kcal/kg bw) Protein:  94-110g Pro (1.2-1.4g/kg bw) Fluid:  >2L fluid (25 ml/kg bw)  Matthew Pacheco, LDN, CNSC Clinical Nutrition  Available Tues-Sat via Pager: [phone] 12/12/2017 3:07 PM

## 2017-12-12 NOTE — Progress Notes (Signed)
PROGRESS NOTE  Matthew Pacheco ZOX:[id number] DOB: March 12, 1931 DOA: 12/11/2017 PCP: No primary care provider on file.   LOS: 1 day   Brief Narrative / Interim history: Matthew Pacheco is a 84 y.o. male with medical history significant for vascular dementia (lives at home w/ wife), CVA (not recent), CAD (stented approximately 12 years ago), paroxysmal atrial fibrillation, barrett's esophagus, who presents after an unwitnessed fall at home, found to have multiple rib fractures.  He was admitted to the hospital and trauma surgery was consulted.  Assessment & Plan: Principal Problem:   Rib fractures Active Problems:   Essential hypertension   Coronary artery disease   Vascular dementia   History of CVA in adulthood   AF (paroxysmal atrial fibrillation) (HCC)   Skin tear of elbow without complication   Rib fractures in the setting of recurrent falls -Left ribs 7-12, partially displaced, with CT showing small left hemothorax versus hematoma.  He has remained hemodynamically stable, chest x-ray this morning without significant changes -Appreciate trauma surgery recommendations -Check a UA to rule out a UTI  Superficial skin lacerations -Wound care  Hypertension -Remains within normal parameters  Paroxysmal A. fib -Normal sinus here, hold aspirin and Plavix.  Not a candidate for anticoagulation right now with concern for hemothorax as well as recurrent falls  Coronary artery disease -No chest pain, this appears stable  Vascular dementia -Stable  Dispo -Awaiting PT/OT evaluation, will likely need SNF   DVT prophylaxis: Lovenox Code Status: Full code Family Communication: No family present at bedside Disposition Plan: TBD  Consultants:   Trauma surgery  Procedures:   None   Antimicrobials:  None    Subjective: -Pleasantly demented, no complaints.  Denies any pain.  Denies any shortness of breath.  No abdominal discomfort  Objective: Vitals:   12/11/17 2240  12/12/17 0000 12/12/17 0400 12/12/17 0831  BP: 136/63 (!) 127/95 (!) 120/47   Pulse: 75 71 (!) 31   Resp: 18 16 15    Temp: 98.3 F (36.8 C) 98.1 F (36.7 C) 98.1 F (36.7 C) 98.5 F (36.9 C)  TempSrc: Oral  Oral Oral  SpO2: 93% 91% 95%   Weight:      Height:        Intake/Output Summary (Last 24 hours) at 12/12/2017 1114 Last data filed at 12/12/2017 0930 Gross per 24 hour  Intake 330 ml  Output -  Net 330 ml   Filed Weights   12/11/17 1753  Weight: 78.5 kg    Examination:  Constitutional: NAD Eyes: lids and conjunctivae normal ENMT: Mucous membranes are moist Neck: normal, supple Respiratory: clear to auscultation bilaterally, no wheezing, no crackles. Normal respiratory effort.  Shallow breathing Cardiovascular: Regular rate and rhythm, no murmurs / rubs / gallops. No LE edema.  Abdomen: no tenderness. Bowel sounds positive.  Neurologic: Nonfocal  Data Reviewed: I have independently reviewed following labs and imaging studies   CBC: Recent Labs  Lab 12/12/17 0307  WBC 12.5*  HGB 13.9  HCT 40.9  MCV 98.1  PLT 171   Basic Metabolic Panel: Recent Labs  Lab 12/12/17 0307  NA 139  K 4.2  CL 104  CO2 30  GLUCOSE 116*  BUN 30*  CREATININE 1.25*  CALCIUM 9.0   GFR: Estimated Creatinine Clearance: 44 mL/min (A) (by C-G formula based on SCr of 1.25 mg/dL (H)). Liver Function Tests: Recent Labs  Lab 12/12/17 0307  AST 21  ALT 20  ALKPHOS 50  BILITOT 1.5*  PROT 5.2*  ALBUMIN 3.2*  No results for input(s): LIPASE, AMYLASE in the last 168 hours. No results for input(s): AMMONIA in the last 168 hours. Coagulation Profile: Recent Labs  Lab 12/12/17 0307  INR 1.07   Cardiac Enzymes: No results for input(s): CKTOTAL, CKMB, CKMBINDEX, TROPONINI in the last 168 hours. BNP (last 3 results) No results for input(s): PROBNP in the last 8760 hours. HbA1C: No results for input(s): HGBA1C in the last 72 hours. CBG: No results for input(s): GLUCAP in  the last 168 hours. Lipid Profile: No results for input(s): CHOL, HDL, LDLCALC, TRIG, CHOLHDL, LDLDIRECT in the last 72 hours. Thyroid Function Tests: No results for input(s): TSH, T4TOTAL, FREET4, T3FREE, THYROIDAB in the last 72 hours. Anemia Panel: No results for input(s): VITAMINB12, FOLATE, FERRITIN, TIBC, IRON, RETICCTPCT in the last 72 hours. Urine analysis: No results found for: COLORURINE, APPEARANCEUR, LABSPEC, PHURINE, GLUCOSEU, HGBUR, BILIRUBINUR, KETONESUR, PROTEINUR, UROBILINOGEN, NITRITE, LEUKOCYTESUR Sepsis Labs: Invalid input(s): PROCALCITONIN, LACTICIDVEN  Recent Results (from the past 240 hour(s))  MRSA PCR Screening     Status: None   Collection Time: 12/11/17  8:07 PM  Result Value Ref Range Status   MRSA by PCR NEGATIVE NEGATIVE Final    Comment:        The GeneXpert MRSA Assay (FDA approved for NASAL specimens only), is one component of a comprehensive MRSA colonization surveillance program. It is not intended to diagnose MRSA infection nor to guide or monitor treatment for MRSA infections. Performed at [facility], [address] [place], [place] [address]       Radiology Studies: Dg Chest Port 1 View  Result Date: 12/12/2017 CLINICAL DATA:  Fall.  LEFT rib fractures. EXAM: PORTABLE CHEST 1 VIEW COMPARISON:  Outside studies are not available for comparison. FINDINGS: The cardiomediastinal silhouette is unremarkable. This is a mildly low volume film with mild bibasilar atelectasis. The known LEFT rib fractures are not well visualized on this radiograph. No pneumothorax or definite pleural effusion. IMPRESSION: Mildly low volume film with mild bibasilar atelectasis. Known LEFT rib fractures not well visualized on this radiograph. Electronically Signed   By: Harmon PierJeffrey  Hu M.D.   On: 12/12/2017 07:13     Scheduled Meds: . acetaminophen  650 mg Oral Q6H  . cholecalciferol  2,000 Units Oral Daily  . citalopram  10 mg Oral Daily  . enoxaparin  (LOVENOX) injection  40 mg Subcutaneous Q24H  . folic acid  1 mg Oral Daily  . lactose free nutrition  237 mL Oral TID WC  . multivitamins with iron  1 tablet Oral Daily  . pantoprazole  40 mg Oral BID  . potassium chloride  10 mEq Oral BID  . sodium chloride flush  3 mL Intravenous Q12H   Continuous Infusions: . sodium chloride 250 mL (12/12/17 1105)    Matthew Pertostin Chantille Navarrete, MD, PhD [facility] Pager [phone]  If 7PM-7AM, please contact night-coverage [email or web address] Password [email or web address] 12/12/2017, 11:14 AM

## 2017-12-13 LAB — CBC
HEMATOCRIT: 38.1 % — AB (ref 39.0–52.0)
Hemoglobin: 12.4 g/dL — ABNORMAL LOW (ref 13.0–17.0)
MCH: 32.4 pg (ref 26.0–34.0)
MCHC: 32.5 g/dL (ref 30.0–36.0)
MCV: 99.5 fL (ref 78.0–100.0)
Platelets: 152 10*3/uL (ref 150–400)
RBC: 3.83 MIL/uL — AB (ref 4.22–5.81)
RDW: 13.1 % (ref 11.5–15.5)
WBC: 12.1 10*3/uL — AB (ref 4.0–10.5)

## 2017-12-13 LAB — BASIC METABOLIC PANEL
ANION GAP: 8 (ref 5–15)
BUN: 39 mg/dL — ABNORMAL HIGH (ref 8–23)
CO2: 25 mmol/L (ref 22–32)
Calcium: 8.9 mg/dL (ref 8.9–10.3)
Chloride: 105 mmol/L (ref 98–111)
Creatinine, Ser: 1.44 mg/dL — ABNORMAL HIGH (ref 0.61–1.24)
GFR, EST AFRICAN AMERICAN: 50 mL/min — AB (ref >60)
GFR, EST NON AFRICAN AMERICAN: 43 mL/min — AB (ref >60)
Glucose, Bld: 109 mg/dL — ABNORMAL HIGH (ref 70–99)
POTASSIUM: 4 mmol/L (ref 3.5–5.1)
Sodium: 138 mmol/L (ref 135–145)

## 2017-12-13 MED ORDER — SODIUM CHLORIDE 0.9 % IV SOLN
INTRAVENOUS | Status: AC
  Administered 2017-12-13 (×2): via INTRAVENOUS

## 2017-12-13 MED ORDER — SODIUM CHLORIDE 0.9 % IV SOLN: 250.0000 mL | INTRAVENOUS | Status: DC | PRN

## 2017-12-13 NOTE — Progress Notes (Signed)
PROGRESS NOTE  Matthew Pacheco ZOX:[id number] DOB: December 01, 1930 DOA: 12/11/2017 PCP: No primary care provider on file.   LOS: 2 days   Brief Narrative / Interim history: Matthew Pacheco is a 84 y.o. male with medical history significant for vascular dementia (lives at home w/ wife), CVA (not recent), CAD (stented approximately 12 years ago), paroxysmal atrial fibrillation, barrett's esophagus, who presents after an unwitnessed fall at home, found to have multiple rib fractures.  He was admitted to the hospital and trauma surgery was consulted.  Assessment & Plan: Principal Problem:   Rib fractures Active Problems:   Essential hypertension   Coronary artery disease   Vascular dementia   History of CVA in adulthood   AF (paroxysmal atrial fibrillation) (HCC)   Skin tear of elbow without complication   Rib fractures in the setting of recurrent falls -Left ribs 7-12, partially displaced, with CT showing small left hemothorax versus hematoma.  He has remained hemodynamically stable, chest x-ray this morning without significant changes -Appreciate trauma surgery recommendations -Physical therapy recommending SNF to which family is agreeable.  Consult SW  Superficial skin lacerations -Wound care  Hypertension -Remains within normal parameters  Paroxysmal A. fib -Normal sinus here, hold aspirin and Plavix.  Not a candidate for anticoagulation right now with concern for hemothorax as well as recurrent falls -Trigeminy/PVCs on the monitor, asymptomatic  Coronary artery disease -No chest pain, this appears stable  Acute kidney injury -Present on admission, likely in the setting of poor p.o. intake, continue IV fluids and increased rates today for slightly worsening creatinine  Vascular dementia -Stable  Dispo -Awaiting PT/OT evaluation, will likely need SNF   DVT prophylaxis: Lovenox Code Status: Full code Family Communication: No family present at bedside Disposition Plan:  TBD  Consultants:   Trauma surgery  Procedures:   None   Antimicrobials:  None    Subjective: -No complaints this morning, denies pain, denies shortness of breath.  No nausea or vomiting.  Objective: Vitals:   12/13/17 0301 12/13/17 0400 12/13/17 0701 12/13/17 0800  BP: (!) 145/76 (!) 173/55  (!) 129/45  Pulse: 78 (!) 28  80  Resp: 18 18  15   Temp: 98.1 F (36.7 C) 97.8 F (36.6 C) (!) 97.3 F (36.3 C)   TempSrc: Oral Oral    SpO2: 95% 95%  99%  Weight:      Height:        Intake/Output Summary (Last 24 hours) at 12/13/2017 1039 Last data filed at 12/13/2017 0500 Gross per 24 hour  Intake 1185.84 ml  Output 560 ml  Net 625.84 ml   Filed Weights   12/11/17 1753  Weight: 78.5 kg    Examination:  Constitutional: NAD Respiratory: CTA biL Cardiovascular: RRR  Data Reviewed: I have independently reviewed following labs and imaging studies   CBC: Recent Labs  Lab 12/12/17 0307 12/13/17 0301  WBC 12.5* 12.1*  HGB 13.9 12.4*  HCT 40.9 38.1*  MCV 98.1 99.5  PLT 171 152   Basic Metabolic Panel: Recent Labs  Lab 12/12/17 0307 12/13/17 0301  NA 139 138  K 4.2 4.0  CL 104 105  CO2 30 25  GLUCOSE 116* 109*  BUN 30* 39*  CREATININE 1.25* 1.44*  CALCIUM 9.0 8.9   GFR: Estimated Creatinine Clearance: 38.2 mL/min (A) (by C-G formula based on SCr of 1.44 mg/dL (H)). Liver Function Tests: Recent Labs  Lab 12/12/17 0307  AST 21  ALT 20  ALKPHOS 50  BILITOT 1.5*  PROT 5.2*  ALBUMIN 3.2*   No results for input(s): LIPASE, AMYLASE in the last 168 hours. No results for input(s): AMMONIA in the last 168 hours. Coagulation Profile: Recent Labs  Lab 12/12/17 0307  INR 1.07   Cardiac Enzymes: No results for input(s): CKTOTAL, CKMB, CKMBINDEX, TROPONINI in the last 168 hours. BNP (last 3 results) No results for input(s): PROBNP in the last 8760 hours. HbA1C: No results for input(s): HGBA1C in the last 72 hours. CBG: No results for input(s):  GLUCAP in the last 168 hours. Lipid Profile: No results for input(s): CHOL, HDL, LDLCALC, TRIG, CHOLHDL, LDLDIRECT in the last 72 hours. Thyroid Function Tests: No results for input(s): TSH, T4TOTAL, FREET4, T3FREE, THYROIDAB in the last 72 hours. Anemia Panel: No results for input(s): VITAMINB12, FOLATE, FERRITIN, TIBC, IRON, RETICCTPCT in the last 72 hours. Urine analysis:    Component Value Date/Time   COLORURINE AMBER (A) 12/12/2017 2320   APPEARANCEUR HAZY (A) 12/12/2017 2320   LABSPEC >1.046 (H) 12/12/2017 2320   PHURINE 5.0 12/12/2017 2320   GLUCOSEU NEGATIVE 12/12/2017 2320   HGBUR NEGATIVE 12/12/2017 2320   BILIRUBINUR NEGATIVE 12/12/2017 2320   KETONESUR NEGATIVE 12/12/2017 2320   PROTEINUR NEGATIVE 12/12/2017 2320   NITRITE NEGATIVE 12/12/2017 2320   LEUKOCYTESUR NEGATIVE 12/12/2017 2320   Sepsis Labs: Invalid input(s): PROCALCITONIN, LACTICIDVEN  Recent Results (from the past 240 hour(s))  MRSA PCR Screening     Status: None   Collection Time: 12/11/17  8:07 PM  Result Value Ref Range Status   MRSA by PCR NEGATIVE NEGATIVE Final    Comment:        The GeneXpert MRSA Assay (FDA approved for NASAL specimens only), is one component of a comprehensive MRSA colonization surveillance program. It is not intended to diagnose MRSA infection nor to guide or monitor treatment for MRSA infections. Performed at [facility], [address] [place], [place] [address]       Radiology Studies: Dg Chest Port 1 View  Result Date: 12/12/2017 CLINICAL DATA:  Fall.  LEFT rib fractures. EXAM: PORTABLE CHEST 1 VIEW COMPARISON:  Outside studies are not available for comparison. FINDINGS: The cardiomediastinal silhouette is unremarkable. This is a mildly low volume film with mild bibasilar atelectasis. The known LEFT rib fractures are not well visualized on this radiograph. No pneumothorax or definite pleural effusion. IMPRESSION: Mildly low volume film with mild bibasilar  atelectasis. Known LEFT rib fractures not well visualized on this radiograph. Electronically Signed   By: Harmon PierJeffrey  Hu M.D.   On: 12/12/2017 07:13     Scheduled Meds: . acetaminophen  650 mg Oral Q6H  . cholecalciferol  2,000 Units Oral Daily  . citalopram  10 mg Oral Daily  . enoxaparin (LOVENOX) injection  40 mg Subcutaneous Q24H  . folic acid  1 mg Oral Daily  . lactose free nutrition  237 mL Oral TID WC  . multivitamins with iron  1 tablet Oral Daily  . pantoprazole  40 mg Oral BID  . potassium chloride  10 mEq Oral BID   Continuous Infusions: . sodium chloride      Pamella Pertostin Nitzia Perren, MD, PhD [facility] Pager [phone]  If 7PM-7AM, please contact night-coverage [email or web address] Password [email or web address] 12/13/2017, 10:39 AM

## 2017-12-13 NOTE — Plan of Care (Signed)
Pt has very poor appetite today. Ate a small amount (less than 25% of his meals). Pt family brought patient's favorite dinner to help encourage pt to eat more but he refused.

## 2017-12-13 NOTE — Progress Notes (Addendum)
[facility] Progress Note     Subjective: CC:  Wife and son at bedside. Patient confused this morning, per family more confused than his baseline. Patient denies pain. Tolerating PO but has poor oral intake. Wife reports poor appetite at home and patient was recently started on low dose prednisone to help stimulate appetite. PT/OT recommending SNF. Family already has a preferred facility - [facility]. Their goal is to get him back home [facility].   Objective: Vital signs in last 24 hours: Temp:  [97.6 F (36.4 C)-98.5 F (36.9 C)] 97.8 F (36.6 C) (08/25 0400) Pulse Rate:  [28-78] 28 (08/25 0400) Resp:  [12-18] 18 (08/25 0400) BP: (117-173)/(44-76) 173/55 (08/25 0400) SpO2:  [95 %-98 %] 95 % (08/25 0400) FiO2 (%):  [2 %] 2 % (08/25 0400) Last BM Date: 12/11/17  Intake/Output from previous day: 08/24 0701 - 08/25 0700 In: 1425.8 [P.O.:540; I.V.:885.8] Out: 560 [Urine:560] Intake/Output this shift: No intake/output data recorded.  PE: Gen:  Alert, NAD, pleasant  Card:  Regular rate and rhythm, pedal pulses 2+ BL Pulm:  Normal effort, clear to auscultation bilaterally, 1250 cc on IS Abd: Soft, non-tender, non-distended, bowel sounds present  Skin: warm and dry, no rashes; L arm with bandages x 2 clean and dry Psych: oriented to self/family, not oriented to place or time.  Lab Results:  Recent Labs    12/12/17 0307 12/13/17 0301  WBC 12.5* 12.1*  HGB 13.9 12.4*  HCT 40.9 38.1*  PLT 171 152   BMET Recent Labs    12/12/17 0307 12/13/17 0301  NA 139 138  K 4.2 4.0  CL 104 105  CO2 30 25  GLUCOSE 116* 109*  BUN 30* 39*  CREATININE 1.25* 1.44*  CALCIUM 9.0 8.9   PT/INR Recent Labs    12/12/17 0307  LABPROT 13.8  INR 1.07   CMP     Component Value Date/Time   NA 138 12/13/2017 0301   K 4.0 12/13/2017 0301   CL 105 12/13/2017 0301   CO2 25 12/13/2017 0301   GLUCOSE 109 (H) 12/13/2017 0301   BUN 39 (H) 12/13/2017 0301    CREATININE 1.44 (H) 12/13/2017 0301   CALCIUM 8.9 12/13/2017 0301   PROT 5.2 (L) 12/12/2017 0307   ALBUMIN 3.2 (L) 12/12/2017 0307   AST 21 12/12/2017 0307   ALT 20 12/12/2017 0307   ALKPHOS 50 12/12/2017 0307   BILITOT 1.5 (H) 12/12/2017 0307   GFRNONAA 43 (L) 12/13/2017 0301   GFRAA 50 (L) 12/13/2017 0301   Lipase  No results found for: LIPASE     Studies/Results: Dg Chest Port 1 View  Result Date: 12/12/2017 CLINICAL DATA:  Fall.  LEFT rib fractures. EXAM: PORTABLE CHEST 1 VIEW COMPARISON:  Outside studies are not available for comparison. FINDINGS: The cardiomediastinal silhouette is unremarkable. This is a mildly low volume film with mild bibasilar atelectasis. The known LEFT rib fractures are not well visualized on this radiograph. No pneumothorax or definite pleural effusion. IMPRESSION: Mildly low volume film with mild bibasilar atelectasis. Known LEFT rib fractures not well visualized on this radiograph. Electronically Signed   By: Harmon PierJeffrey  Hu M.D.   On: 12/12/2017 07:13    Anti-infectives: Anti-infectives (From admission, onward)   None     Assessment/Plan PMH CVA CAD on plavix and ASA - continue to hold DAPT, spoke with MD, can resume upon discharge to SNF  Paroxysmal a.fib  GERD Fall L Rib FX 7-12 -  pain control, IS/pulm toilet  Small L HTX vs extrapleural hematoma - no PTX on follow up CXR 8/24  FEN - Reg diet, hypokalemia improved  ID - no abx,  Afebrile, WBC 12.1 from 12.4, UA negative VTE - SCD's, lovenox  Dispo - SDU, PT/OT recommending SNF     LOS: 2 days    Hosie Spangle, [facility] Pager: [phone]

## 2017-12-13 NOTE — NC FL2 (Signed)
[place] MEDICAID FL2 LEVEL OF CARE SCREENING TOOL     IDENTIFICATION  Patient Name: Matthew Pacheco Birthdate: 03/13/1931 Sex: male Admission Date (Current Location): 12/11/2017  [place] and [place] Number:  Janina Mayo)   Facility and Address:  [facility], [address], [place], [place] [address]      Provider Number: [id number]  Attending Physician Name and Address:  Leatha Gilding, MD  Relative Name and Phone Number:       Current Level of Care: Hospital Recommended Level of Care: Skilled Nursing Facility Prior Approval Number:    Date Approved/Denied:   PASRR Number: [id number]  Discharge Plan: SNF    Current Diagnoses: Patient Active Problem List   Diagnosis Date Noted  . Essential hypertension 12/11/2017  . Coronary artery disease 12/11/2017  . Vascular dementia 12/11/2017  . History of CVA in adulthood 12/11/2017  . AF (paroxysmal atrial fibrillation) (HCC) 12/11/2017  . Skin tear of elbow without complication 12/11/2017  . Rib fractures 12/11/2017    Orientation RESPIRATION BLADDER Height & Weight     Self  O2(2L [place]) Continent Weight: 173 lb 1 oz (78.5 kg) Height:  5\' 9"  (175.3 cm)  BEHAVIORAL SYMPTOMS/MOOD NEUROLOGICAL BOWEL NUTRITION STATUS      Continent Diet(regular)  AMBULATORY STATUS COMMUNICATION OF NEEDS Skin   Extensive Assist Verbally Normal                       Personal Care Assistance Level of Assistance  Bathing, Dressing Bathing Assistance: Maximum assistance   Dressing Assistance: Maximum assistance     Functional Limitations Info             SPECIAL CARE FACTORS FREQUENCY  PT (By licensed PT), OT (By licensed OT)     PT Frequency: 5/wk OT Frequency: 5/wk            Contractures      Additional Factors Info  Code Status, Allergies Code Status Info: FULL Allergies Info: Penicillins, Phenobarbital           Current Medications (12/13/2017):  This is the current hospital  active medication list Current Facility-Administered Medications  Medication Dose Route Frequency Provider Last Rate Last Dose  . 0.9 %  sodium chloride infusion   Intravenous Continuous Leatha Gilding, MD 100 mL/hr at 12/13/17 1201    . acetaminophen (TYLENOL) tablet 650 mg  650 mg Oral Q6H Gaynelle Adu, MD   650 mg at 12/13/17 1147  . cholecalciferol (VITAMIN D) tablet 2,000 Units  2,000 Units Oral Daily Audrea Muscat T, NP   2,000 Units at 12/13/17 1146  . citalopram (CELEXA) tablet 10 mg  10 mg Oral Daily Blount, Andi Devon T, NP   10 mg at 12/13/17 1147  . enoxaparin (LOVENOX) injection 40 mg  40 mg Subcutaneous Q24H Adam Phenix, PA-C   40 mg at 12/13/17 1148  . folic acid (FOLVITE) tablet 1 mg  1 mg Oral Daily Blount, Xenia T, NP   1 mg at 12/13/17 1146  . HYDROmorphone (DILAUDID) injection 0.5 mg  0.5 mg Intravenous Q3H PRN Wouk, Wilfred Curtis, MD      . ibuprofen (ADVIL,MOTRIN) tablet 600 mg  600 mg Oral Q6H PRN Gaynelle Adu, MD      . lactose free nutrition (BOOST PLUS) liquid 237 mL  237 mL Oral TID WC Leatha Gilding, MD   237 mL at 12/12/17 1731  . multivitamins with iron tablet 1 tablet  1  tablet Oral Daily Audrea MuscatBlount, Xenia T, NP   1 tablet at 12/12/17 1045  . pantoprazole (PROTONIX) EC tablet 40 mg  40 mg Oral BID Audrea MuscatBlount, Xenia T, NP   40 mg at 12/13/17 1147  . potassium chloride (K-DUR,KLOR-CON) CR tablet 10 mEq  10 mEq Oral BID Audrea MuscatBlount, Xenia T, NP   10 mEq at 12/13/17 1147  . traMADol (ULTRAM) tablet 50 mg  50 mg Oral Q6H PRN Kathrynn RunningWouk, Noah Bedford, MD   50 mg at 12/12/17 [id number]     Discharge Medications: Please see discharge summary for a list of discharge medications.  Relevant Imaging Results:  Relevant Lab Results:   Additional Information SS#: [id number]  Burna SisUris, Sharell Hilmer H, LCSW

## 2017-12-14 LAB — BASIC METABOLIC PANEL
Anion gap: 7 (ref 5–15)
BUN: 27 mg/dL — AB (ref 8–23)
CHLORIDE: 109 mmol/L (ref 98–111)
CO2: 22 mmol/L (ref 22–32)
CREATININE: 0.91 mg/dL (ref 0.61–1.24)
Calcium: 8.8 mg/dL — ABNORMAL LOW (ref 8.9–10.3)
GFR calc Af Amer: >60 mL/min (ref >60)
GFR calc non Af Amer: >60 mL/min (ref >60)
GLUCOSE: 148 mg/dL — AB (ref 70–99)
Potassium: 3.7 mmol/L (ref 3.5–5.1)
SODIUM: 138 mmol/L (ref 135–145)

## 2017-12-14 LAB — MAGNESIUM: MAGNESIUM: 2.1 mg/dL (ref 1.7–2.4)

## 2017-12-14 NOTE — Clinical Social Work Note (Signed)
Clinical Social Work Assessment  Patient Details  Name: Matthew Pacheco MRN: [id number] Date of Birth: 12-21-1930  Date of referral:  12/14/17               Reason for consult:  Facility Placement, Discharge Planning                Permission sought to share information with:  Family Supports, Customer service manager Permission granted to share information::  Yes, Verbal Permission Granted  Name::     [employer].   Agency::  [facility]  Relationship::  son  Contact Information:  [phone]  Housing/Transportation Living arrangements for the past 2 months:  [place] of Information:  Adult Children Patient Interpreter Needed:  None Criminal Activity/Legal Involvement Pertinent to Current Situation/Hospitalization:  No - Comment as needed Significant Relationships:  Adult Children Lives with:  Spouse Do you feel safe going back to the place where you live?  Yes Need for family participation in patient care:  Yes (Comment)  Care giving concerns:  Pt has dementia, has had multiple falls and is requiring more assistance than can currently be handled at home.    Social Worker assessment / plan:  CSW met with pt and pt son, pt disoriented so pt son completed assessment. Pt lives at home with his wife who according to RN report also has memory losses. Pt and pt family are from [facility] and would like pt there, requesting CSW send referral to [facility] where pt family has had family members there before. Pt son understands that we need information regarding insurance and transportation will have to be private pay to SNF due to being outside of 50 mile radius.   Employment status:  Retired Forensic scientist:  [employer] PT Recommendations:  [place], [place] / Referral to community resources:  [place]  Patient/Family's Response to care: Pt son understands therapy recommendations,  amenable to SNF understand that there may be delay in process due to insurance information not being on file and distance of preferred SNFs. Pt son states understanding of private pay transportation due to proximity.   Patient/Family's Understanding of and Emotional Response to Diagnosis, Current Treatment, and Prognosis:  Pt son understanding of diagnosis, current treatment and prognosis. Pt son appears worried about moving pt closer to home due to distance of driving back and forth from hospital. Pt son is emotionally appropriate and expresses reasonable expectations of pt progress and further needs.   Emotional Assessment Appearance:  Appears stated age Attitude/Demeanor/Rapport:  Gracious Affect (typically observed):  Unable to Assess Orientation:  Oriented to Self, Fluctuating Orientation (Suspected and/or reported Sundowners) Alcohol / Substance use:  Not Applicable Psych involvement (Current and /or in the community):  No (Comment)  Discharge Needs  Concerns to be addressed:  Care Coordination Readmission within the last 30 days:  No Current discharge risk:  Dependent with Mobility, Physical Impairment Barriers to Discharge:  Continued Medical Work up, No SNF bed   Alexander Mt, [place] 12/14/2017, 12:35 PM

## 2017-12-14 NOTE — Progress Notes (Signed)
PROGRESS NOTE  Matthew Pacheco ZOX:[id number] DOB: 09/24/1930 DOA: 12/11/2017 PCP: No primary care provider on file.   LOS: 3 days   Brief Narrative / Interim history: Matthew Pacheco is a 84 y.o. male with medical history significant for vascular dementia (lives at home w/ wife), CVA (not recent), CAD (stented approximately 12 years ago), paroxysmal atrial fibrillation, barrett's esophagus, who presents after an unwitnessed fall at home, found to have multiple rib fractures.  He was admitted to the hospital and trauma surgery was consulted.  Assessment & Plan: Principal Problem:   Rib fractures Active Problems:   Essential hypertension   Coronary artery disease   Vascular dementia   History of CVA in adulthood   AF (paroxysmal atrial fibrillation) (HCC)   Skin tear of elbow without complication   Rib fractures in the setting of recurrent falls -Left ribs 7-12, partially displaced, with CT showing small left hemothorax versus hematoma.  He has remained hemodynamically stable, chest x-ray this morning without significant changes -Appreciate trauma surgery recommendations -Physical therapy recommending SNF to which family is agreeable. Placement pending, d/w SW  Superficial skin lacerations -Wound care  Hypertension -Remains within normal parameters  Paroxysmal A. fib -Normal sinus here, hold aspirin and Plavix.  Not a candidate for anticoagulation right now with concern for hemothorax as well as recurrent falls -Trigeminy/PVCs on the monitor, asymptomatic  Coronary artery disease -No chest pain, this appears stable  Acute kidney injury -Present on admission, likely in the setting of poor p.o. intake, continue IV fluids and increased rates today for slightly worsening creatinine -normal today   Vascular dementia -Stable  Dispo -Awaiting PT/OT evaluation, will likely need SNF   DVT prophylaxis: Lovenox Code Status: Full code Family Communication: No family present at  bedside Disposition Plan: TBD  Consultants:   Trauma surgery  Procedures:   None   Antimicrobials:  None    Subjective: -confused, restless  Objective: Vitals:   12/14/17 0711 12/14/17 0730 12/14/17 1200 12/14/17 1300  BP:  (!) 178/144 (!) 153/59 (!) 171/139  Pulse:  (!) 29 (!) 58 86  Resp:  19 11 17   Temp: 97.9 F (36.6 C)     TempSrc:      SpO2:  94% 98% 99%  Weight:      Height:        Intake/Output Summary (Last 24 hours) at 12/14/2017 1530 Last data filed at 12/14/2017 1300 Gross per 24 hour  Intake 1080.84 ml  Output 1050 ml  Net 30.84 ml   Filed Weights   12/11/17 1753  Weight: 78.5 kg    Examination:  Constitutional: NAD Respiratory: CTA Cardiovascular: RRR  Data Reviewed: I have independently reviewed following labs and imaging studies   CBC: Recent Labs  Lab 12/12/17 0307 12/13/17 0301  WBC 12.5* 12.1*  HGB 13.9 12.4*  HCT 40.9 38.1*  MCV 98.1 99.5  PLT 171 152   Basic Metabolic Panel: Recent Labs  Lab 12/12/17 0307 12/13/17 0301 12/14/17 0232  NA 139 138 138  K 4.2 4.0 3.7  CL 104 105 109  CO2 30 25 22   GLUCOSE 116* 109* 148*  BUN 30* 39* 27*  CREATININE 1.25* 1.44* 0.91  CALCIUM 9.0 8.9 8.8*  MG  --   --  2.1   GFR: Estimated Creatinine Clearance: 60.4 mL/min (by C-G formula based on SCr of 0.91 mg/dL). Liver Function Tests: Recent Labs  Lab 12/12/17 0307  AST 21  ALT 20  ALKPHOS 50  BILITOT 1.5*  PROT 5.2*  ALBUMIN 3.2*   No results for input(s): LIPASE, AMYLASE in the last 168 hours. No results for input(s): AMMONIA in the last 168 hours. Coagulation Profile: Recent Labs  Lab 12/12/17 0307  INR 1.07   Cardiac Enzymes: No results for input(s): CKTOTAL, CKMB, CKMBINDEX, TROPONINI in the last 168 hours. BNP (last 3 results) No results for input(s): PROBNP in the last 8760 hours. HbA1C: No results for input(s): HGBA1C in the last 72 hours. CBG: No results for input(s): GLUCAP in the last 168  hours. Lipid Profile: No results for input(s): CHOL, HDL, LDLCALC, TRIG, CHOLHDL, LDLDIRECT in the last 72 hours. Thyroid Function Tests: No results for input(s): TSH, T4TOTAL, FREET4, T3FREE, THYROIDAB in the last 72 hours. Anemia Panel: No results for input(s): VITAMINB12, FOLATE, FERRITIN, TIBC, IRON, RETICCTPCT in the last 72 hours. Urine analysis:    Component Value Date/Time   COLORURINE AMBER (A) 12/12/2017 2320   APPEARANCEUR HAZY (A) 12/12/2017 2320   LABSPEC >1.046 (H) 12/12/2017 2320   PHURINE 5.0 12/12/2017 2320   GLUCOSEU NEGATIVE 12/12/2017 2320   HGBUR NEGATIVE 12/12/2017 2320   BILIRUBINUR NEGATIVE 12/12/2017 2320   KETONESUR NEGATIVE 12/12/2017 2320   PROTEINUR NEGATIVE 12/12/2017 2320   NITRITE NEGATIVE 12/12/2017 2320   LEUKOCYTESUR NEGATIVE 12/12/2017 2320   Sepsis Labs: Invalid input(s): PROCALCITONIN, LACTICIDVEN  Recent Results (from the past 240 hour(s))  MRSA PCR Screening     Status: None   Collection Time: 12/11/17  8:07 PM  Result Value Ref Range Status   MRSA by PCR NEGATIVE NEGATIVE Final    Comment:        The GeneXpert MRSA Assay (FDA approved for NASAL specimens only), is one component of a comprehensive MRSA colonization surveillance program. It is not intended to diagnose MRSA infection nor to guide or monitor treatment for MRSA infections. Performed at [facility], [address] [place], [place] [address]       Radiology Studies: No results found.   Scheduled Meds: . acetaminophen  650 mg Oral Q6H  . cholecalciferol  2,000 Units Oral Daily  . citalopram  10 mg Oral Daily  . enoxaparin (LOVENOX) injection  40 mg Subcutaneous Q24H  . folic acid  1 mg Oral Daily  . lactose free nutrition  237 mL Oral TID WC  . multivitamins with iron  1 tablet Oral Daily  . pantoprazole  40 mg Oral BID  . potassium chloride  10 mEq Oral BID   Continuous Infusions:   Pamella Pertostin Andrei Mccook, MD, PhD [facility] Pager [phone]  [phone]  If 7PM-7AM, please contact night-coverage [email or web address] Password [email or web address] 12/14/2017, 3:30 PM

## 2017-12-14 NOTE — Social Work (Addendum)
CSW has faxed [facility] director Erie NoeVanessa, she is currently reviewing clinicals for pt, will f/u with us for admit.   Received call that pt is not safe to transport in personal vehicle will get estimate for [facility] and [employer] estimates for transport.   3:50pm- CSW received a call from [place] with [facility], they are unable to take pt "do not feel they can meet pt's needs." The SNF will f/u with pt's son.   4:15pm- CSW spoke with pt's son, pt's son requesting [facility] be [facility], attn Shirlyn GoltzJennifer Mathis fax is [phone]  4:45pm- Referral sent, [facility] state they will f/u with CSW tomorrow.   Matthew HutchingIsabel H Juley Pacheco, LCSWA [facility] Clinical Social Work [phone]

## 2017-12-14 NOTE — Progress Notes (Signed)
This rn arrived to room to administer medication to find pt not fully clothed restless/fidgity and trying to remove equipment, pt placed back into gown reoriented and reassured. Pt refused po tylenol by turning head side to side and spit med out of mouth despite support encouragement and explanation provided. Pt trying to get out of bed and removed condom cath, bilateral soft mittens applied. Pt inconsistent when need for bathroom assessed, pt physically resistant when bedpan/urinal offered. Pt gradually consolable. Pt wife at bedside asleep.

## 2017-12-14 NOTE — Progress Notes (Signed)
Patient ID: Lorrin JacksonWilliam Edling, male   DOB: 10/14/1930, 84 y.o.   MRN: [id number]       Subjective: Patient sitting up in his chair and states he doesn't feel great, but can't give a reason why.  He is eating his breakfast.  His mittens are off and he seems fine.  No BM since admission.  No issues with breathing.  Only taking prn tylenol.  Objective: Vital signs in last 24 hours: Temp:  [97.7 F (36.5 C)-98.6 F (37 C)] 98.2 F (36.8 C) (08/26 0300) Pulse Rate:  [82-93] 93 (08/26 0300) Resp:  [19-24] 20 (08/26 0300) BP: (106-160)/(47-110) 126/110 (08/26 0300) SpO2:  [94 %-99 %] 95 % (08/26 0300) Last BM Date: 12/11/17  Intake/Output from previous day: 08/25 0701 - 08/26 0700 In: 1826.9 [P.O.:600; I.V.:1226.9] Out: 1050 [Urine:1050] Intake/Output this shift: No intake/output data recorded.  PE: Heart: regular with trigeminy noted on monitor Lungs: CTAB.  Moves good air.  Son said he got up to about 1500 this am. Abd: soft, NT, ND, +BS  Lab Results:  Recent Labs    12/12/17 0307 12/13/17 0301  WBC 12.5* 12.1*  HGB 13.9 12.4*  HCT 40.9 38.1*  PLT 171 152   BMET Recent Labs    12/13/17 0301 12/14/17 0232  NA 138 138  K 4.0 3.7  CL 105 109  CO2 25 22  GLUCOSE 109* 148*  BUN 39* 27*  CREATININE 1.44* 0.91  CALCIUM 8.9 8.8*   PT/INR Recent Labs    12/12/17 0307  LABPROT 13.8  INR 1.07   CMP     Component Value Date/Time   NA 138 12/14/2017 0232   K 3.7 12/14/2017 0232   CL 109 12/14/2017 0232   CO2 22 12/14/2017 0232   GLUCOSE 148 (H) 12/14/2017 0232   BUN 27 (H) 12/14/2017 0232   CREATININE 0.91 12/14/2017 0232   CALCIUM 8.8 (L) 12/14/2017 0232   PROT 5.2 (L) 12/12/2017 0307   ALBUMIN 3.2 (L) 12/12/2017 0307   AST 21 12/12/2017 0307   ALT 20 12/12/2017 0307   ALKPHOS 50 12/12/2017 0307   BILITOT 1.5 (H) 12/12/2017 0307   GFRNONAA >60 12/14/2017 0232   GFRAA >60 12/14/2017 0232   Lipase  No results found for: LIPASE     Studies/Results: No  results found.  Anti-infectives: Anti-infectives (From admission, onward)   None       Assessment/Plan PMH CVA CAD on plavix and ASA - continue to hold DAPT, spoke with MD, can resume upon discharge to SNF  Paroxysmal a.fib  GERD Fall L Rib FX 7-12 - pain control, IS/pulm toilet  Small L HTX vs extrapleural hematoma - no PTX on follow up CXR 8/24  FEN - Reg diet, hypokalemia improved  ID - no abx,  Afebrile VTE - SCD's, lovenox  Dispo - patient is stable from trauma standpoint for DC to SNF when medically stable.  He can follow up with his PCP after discharge as needed.    LOS: 3 days    Letha CapeKelly E Norene Oliveri , [facility] 12/14/2017, 9:03 AM Pager: [phone]

## 2017-12-15 DIAGNOSIS — S51012D Laceration without foreign body of left elbow, subsequent encounter: Secondary | ICD-10-CM

## 2017-12-15 NOTE — Social Work (Signed)
CSW aware that [facility]. Called and received estimate for cost of transportation will bring that to pt son, able to support discharge when summary and prescriptions ready, will let MD know.   Doy HutchingIsabel H Vanna Sailer, LCSWA [facility] Clinical Social Work [phone]

## 2017-12-15 NOTE — Care Management Note (Signed)
Case Management Note  Patient Details  Name: Matthew JacksonWilliam Suminski MRN: [id number] Date of Birth: 08/01/1930  Subjective/Objective:  84 y.o. male admitted on 12/11/17 for fall with L rib fractures 7-12 and small L hemothorax vs hematima.  Pt also with superficial skin lacerations/tears on his left forearm and elbow.  PTA, pt resided at home with spouse.  He has had increased falls recently, per report.                   Action/Plan: PT/OT recommending SNF at dc, and [place].  CSW following to facilitate dc to SNF upon medical stability..  Expected Discharge Date:  12/15/17               Expected Discharge Plan:  Skilled Nursing Facility  In-House Referral:  Clinical Social Work  Discharge planning Services  CM Consult  Post Acute Care Choice:    Choice offered to:     DME Arranged:    DME Agency:     HH Arranged:    HH Agency:     Status of Service:  Completed, signed off  If discussed at [employer] Meetings, dates discussed:    Additional Comments:  12/15/17 J. Tayton Decaire, RN, BSN Pt medically stable for dc to SNF today, per CSW arrangements.    Glennon MacAmerson, Steve Gregg M, RN 12/15/2017, 1:05 PM

## 2017-12-15 NOTE — Discharge Summary (Signed)
Physician Discharge Summary  Matthew Pacheco ZOX:[id number] DOB: 30-Apr-1930 DOA: 12/11/2017  PCP: No primary care provider on file.  Admit date: 12/11/2017 Discharge date: 12/15/2017  Admitted From: home Disposition:  SNF  Recommendations for Outpatient Follow-up:  1. Follow up with PCP in 1-2 weeks 2. Please obtain BMP/CBC in one week  Home Health: none Equipment/Devices: none  Discharge Condition: stable CODE STATUS: Full code Diet recommendation: regular  HPI: Per Dr. Ashok Pall, Matthew Pacheco is a 84 y.o. male with medical history significant for vascular dementia (lives at home w/ wife), CVA (not recent), CAD (stented approximately 12 years ago), paroxysmal atrial fibrillation, barrett's esophagus, who presents with above. Unwitnessed fall at home. Walks with a walker. Had fallen several weeks ago as well and had skin tears on left arm that were re-injured. Wife went to see immediately after, patient alert but confused, complaining of left lateral rib pain. No recent illnesses, has been in usual state of health, but is quite unstable on feet at baseline. Has not complained of fever or chest pain. Normal PO, no vomiting or diarrhea. History is from wife and son; patient has no complaints but unable to provide history. ED Course: CT chest/abdomen/pelvis. Trauma surg consult. Vitals reportedly wnl and stable. Labs sig for k 3.3, gluc 148, normal hemoglobin. Ct head  Hospital Course: Rib fractures in the setting of recurrent falls -Left ribs 7-12, partially displaced, with CT showing small left hemothorax versus hematoma.  He has remained hemodynamically stable, repeat chest x-ray without significant changes.  Trauma surgery was consulted and followed patient while hospitalized.  They recommended to hold dual antiplatelet therapy while in the hospital but okay to resume upon discharge to SNF Superficial skin lacerations -Wound care Hypertension -Remains within normal parameters Paroxysmal A.  Fib -Normal sinus here.  Not a candidate for anticoagulation right now with concern for hemothorax as well as recurrent falls Coronary artery disease -No chest pain, this appears stable Acute kidney injury -Present on admission, likely in the setting of poor p.o. intake, his kidney function has normalized with fluids.  Please ensure patient has adequate p.o. intake Vascular dementia -Stable Dispo -discharge to SNF   Discharge Diagnoses:  Principal Problem:   Rib fractures Active Problems:   Essential hypertension   Coronary artery disease   Vascular dementia   History of CVA in adulthood   AF (paroxysmal atrial fibrillation) (HCC)   Skin tear of elbow without complication     Discharge Instructions   Allergies as of 12/15/2017      Reactions   Penicillins Other (See Comments)   Has patient had a PCN reaction causing immediate rash, facial/tongue/throat swelling, SOB or lightheadedness with hypotension: Unknown Has patient had a PCN reaction causing severe rash involving mucus membranes or skin necrosis: Unknown Has patient had a PCN reaction that required hospitalization: Yes Has patient had a PCN reaction occurring within the last 10 years: No If all of the above answers are "NO", then may proceed with Cephalosporin use.   Phenobarbital Other (See Comments)   unknown reaction       Medication List    TAKE these medications   aspirin EC 81 MG tablet Take 81 mg by mouth daily.   atorvastatin 40 MG tablet Commonly known as:  LIPITOR Take 40 mg by mouth daily at 6 PM.   citalopram 10 MG tablet Commonly known as:  CELEXA Take 10 mg by mouth daily.   clopidogrel 75 MG tablet Commonly known as:  PLAVIX Take  75 mg by mouth daily.   folic acid 1 MG tablet Commonly known as:  FOLVITE Take 1 mg by mouth daily.   multivitamin-iron-minerals-folic acid chewable tablet Chew 1 tablet by mouth daily.   omeprazole 40 MG capsule Commonly known as:  PRILOSEC Take 40 mg by  mouth 2 (two) times daily.   potassium chloride 10 MEQ tablet Commonly known as:  K-DUR,KLOR-CON Take 10 mEq by mouth 2 (two) times daily.   predniSONE 2.5 MG tablet Commonly known as:  DELTASONE Take 2.5 mg by mouth daily with breakfast.   SYSTANE 0.4-0.3 % Soln Generic drug:  Polyethyl Glycol-Propyl Glycol Apply 1 drop to eye as needed (eye irritation).   Vitamin D 2000 units Caps Take 2,000 Units by mouth daily.       Consultations:  Trauma surgery   Procedures/Studies:  Dg Chest Port 1 View  Result Date: 12/12/2017 CLINICAL DATA:  Fall.  LEFT rib fractures. EXAM: PORTABLE CHEST 1 VIEW COMPARISON:  Outside studies are not available for comparison. FINDINGS: The cardiomediastinal silhouette is unremarkable. This is a mildly low volume film with mild bibasilar atelectasis. The known LEFT rib fractures are not well visualized on this radiograph. No pneumothorax or definite pleural effusion. IMPRESSION: Mildly low volume film with mild bibasilar atelectasis. Known LEFT rib fractures not well visualized on this radiograph. Electronically Signed   By: Harmon Pier M.D.   On: 12/12/2017 07:13      Subjective: - Dementia, pleasant, no complaints  Discharge Exam: Vitals:   12/14/17 1700 12/15/17 0700  BP: (!) 151/84   Pulse:    Resp: (!) 21   Temp: 98.3 F (36.8 C) 98.1 F (36.7 C)  SpO2:      General: Pt is alert, awake, not in acute distress Cardiovascular: RRR, S1/S2 +, no rubs, no gallops Respiratory: CTA bilaterally, no wheezing, no rhonchi Abdominal: Soft, NT, ND, bowel sounds + Extremities: no edema, no cyanosis    The results of significant diagnostics from this hospitalization (including imaging, microbiology, ancillary and laboratory) are listed below for reference.     Microbiology: Recent Results (from the past 240 hour(s))  MRSA PCR Screening     Status: None   Collection Time: 12/11/17  8:07 PM  Result Value Ref Range Status   MRSA by PCR  NEGATIVE NEGATIVE Final    Comment:        The GeneXpert MRSA Assay (FDA approved for NASAL specimens only), is one component of a comprehensive MRSA colonization surveillance program. It is not intended to diagnose MRSA infection nor to guide or monitor treatment for MRSA infections. Performed at [facility], [address], [place], [place] [address]      Labs: BNP (last 3 results) No results for input(s): BNP in the last 8760 hours. Basic Metabolic Panel: Recent Labs  Lab 12/12/17 0307 12/13/17 0301 12/14/17 0232  NA 139 138 138  K 4.2 4.0 3.7  CL 104 105 109  CO2 30 25 22   GLUCOSE 116* 109* 148*  BUN 30* 39* 27*  CREATININE 1.25* 1.44* 0.91  CALCIUM 9.0 8.9 8.8*  MG  --   --  2.1   Liver Function Tests: Recent Labs  Lab 12/12/17 0307  AST 21  ALT 20  ALKPHOS 50  BILITOT 1.5*  PROT 5.2*  ALBUMIN 3.2*   No results for input(s): LIPASE, AMYLASE in the last 168 hours. No results for input(s): AMMONIA in the last 168 hours. CBC: Recent Labs  Lab 12/12/17 [phone]  12/13/17 0301  WBC 12.5* 12.1*  HGB 13.9 12.4*  HCT 40.9 38.1*  MCV 98.1 99.5  PLT 171 152   Cardiac Enzymes: No results for input(s): CKTOTAL, CKMB, CKMBINDEX, TROPONINI in the last 168 hours. BNP: Invalid input(s): POCBNP CBG: No results for input(s): GLUCAP in the last 168 hours. D-Dimer No results for input(s): DDIMER in the last 72 hours. Hgb A1c No results for input(s): HGBA1C in the last 72 hours. Lipid Profile No results for input(s): CHOL, HDL, LDLCALC, TRIG, CHOLHDL, LDLDIRECT in the last 72 hours. Thyroid function studies No results for input(s): TSH, T4TOTAL, T3FREE, THYROIDAB in the last 72 hours.  Invalid input(s): FREET3 Anemia work up No results for input(s): VITAMINB12, FOLATE, FERRITIN, TIBC, IRON, RETICCTPCT in the last 72 hours. Urinalysis    Component Value Date/Time   COLORURINE AMBER (A) 12/12/2017 2320   APPEARANCEUR HAZY (A) 12/12/2017 2320   LABSPEC  >1.046 (H) 12/12/2017 2320   PHURINE 5.0 12/12/2017 2320   GLUCOSEU NEGATIVE 12/12/2017 2320   HGBUR NEGATIVE 12/12/2017 2320   BILIRUBINUR NEGATIVE 12/12/2017 2320   KETONESUR NEGATIVE 12/12/2017 2320   PROTEINUR NEGATIVE 12/12/2017 2320   NITRITE NEGATIVE 12/12/2017 2320   LEUKOCYTESUR NEGATIVE 12/12/2017 2320   Sepsis Labs Invalid input(s): PROCALCITONIN,  WBC,  LACTICIDVEN   Time coordinating discharge: 40 minutes  SIGNED:  Pamella Pertostin Ziah Leandro, MD  [facility] 12/15/2017, 11:29 AM Pager [phone]  If 7PM-7AM, please contact night-coverage [email or web address] Password [email or web address]

## 2017-12-15 NOTE — Clinical Social Work Placement (Signed)
   CLINICAL SOCIAL WORK PLACEMENT  NOTE [facility] RN to call report to [phone]  Date:  12/15/2017  Patient Details  Name: Matthew Pacheco MRN: [id number] Date of Birth: 11/23/1930  Clinical Social Work is seeking post-discharge placement for this patient at the Skilled  Nursing Facility level of care (*CSW will initial, date and re-position this form in  chart as items are completed):  Yes   Patient/family provided with [facility] Clinical Social Work Department's list of facilities offering this level of care within the geographic area requested by the patient (or if unable, by the patient's family).  Yes   Patient/family informed of their freedom to choose among providers that offer the needed level of care, that participate in Medicare, Medicaid or managed care program needed by the patient, have an available bed and are willing to accept the patient.  Yes   Patient/family informed of [facility]'s ownership interest in [facility] and [facility], as well as of the fact that they are under no obligation to receive care at these facilities.  PASRR submitted to EDS on       PASRR number received on 12/13/17     Existing PASRR number confirmed on       FL2 transmitted to all facilities in geographic area requested by pt/family on 12/14/17     FL2 transmitted to all facilities within larger geographic area on       Patient informed that his/her managed care company has contracts with or will negotiate with certain facilities, including the following:        Yes   Patient/family informed of bed offers received.  Patient chooses bed at Other - please specify in the comment section below:([facility])     Physician recommends and patient chooses bed at      Patient to be transferred to Other - please specify in the comment section below:([facility]) on 12/15/17.  Patient to be transferred to facility by [facility]     Patient family notified on  12/15/17 of transfer.  Name of family member notified:  son, Chrissie NoaWilliam     PHYSICIAN       Additional Comment:    _______________________________________________ Doy HutchingIsabel H Merve Hotard, LCSWA 12/15/2017, 12:09 PM

## 2017-12-15 NOTE — Progress Notes (Signed)
Spoke with both of patient's sons. Per sons, [facility] called back this evening saying they had made a mistake and would accept the patient. Advised that I would notify oncoming shift so that Social work will be aware. Son also stated he would call Isabel in the AM.

## 2017-12-15 NOTE — Social Work (Signed)
Clinical Social Worker facilitated patient discharge including contacting patient family and facility to confirm patient discharge plans.  Clinical information faxed to facility and family agreeable with plan.  CSW [facility] [place]  RN to call [phone] with report  prior to discharge.  Clinical Social Worker will sign off for now as social work intervention is no longer needed. Please consult us again if new need arises.  Matthew HutchingIsabel H Ivery Pacheco, [place] Clinical Social Worker [phone]

## 2020-01-10 IMAGING — DX DG CHEST 1V PORT
1 series · 1 of 1 positions shown · non-contrast
Comparison: Outside studies are not available for comparison.

CLINICAL DATA: Fall.  LEFT rib fractures.

EXAM:
PORTABLE CHEST 1 VIEW

[chest ap]
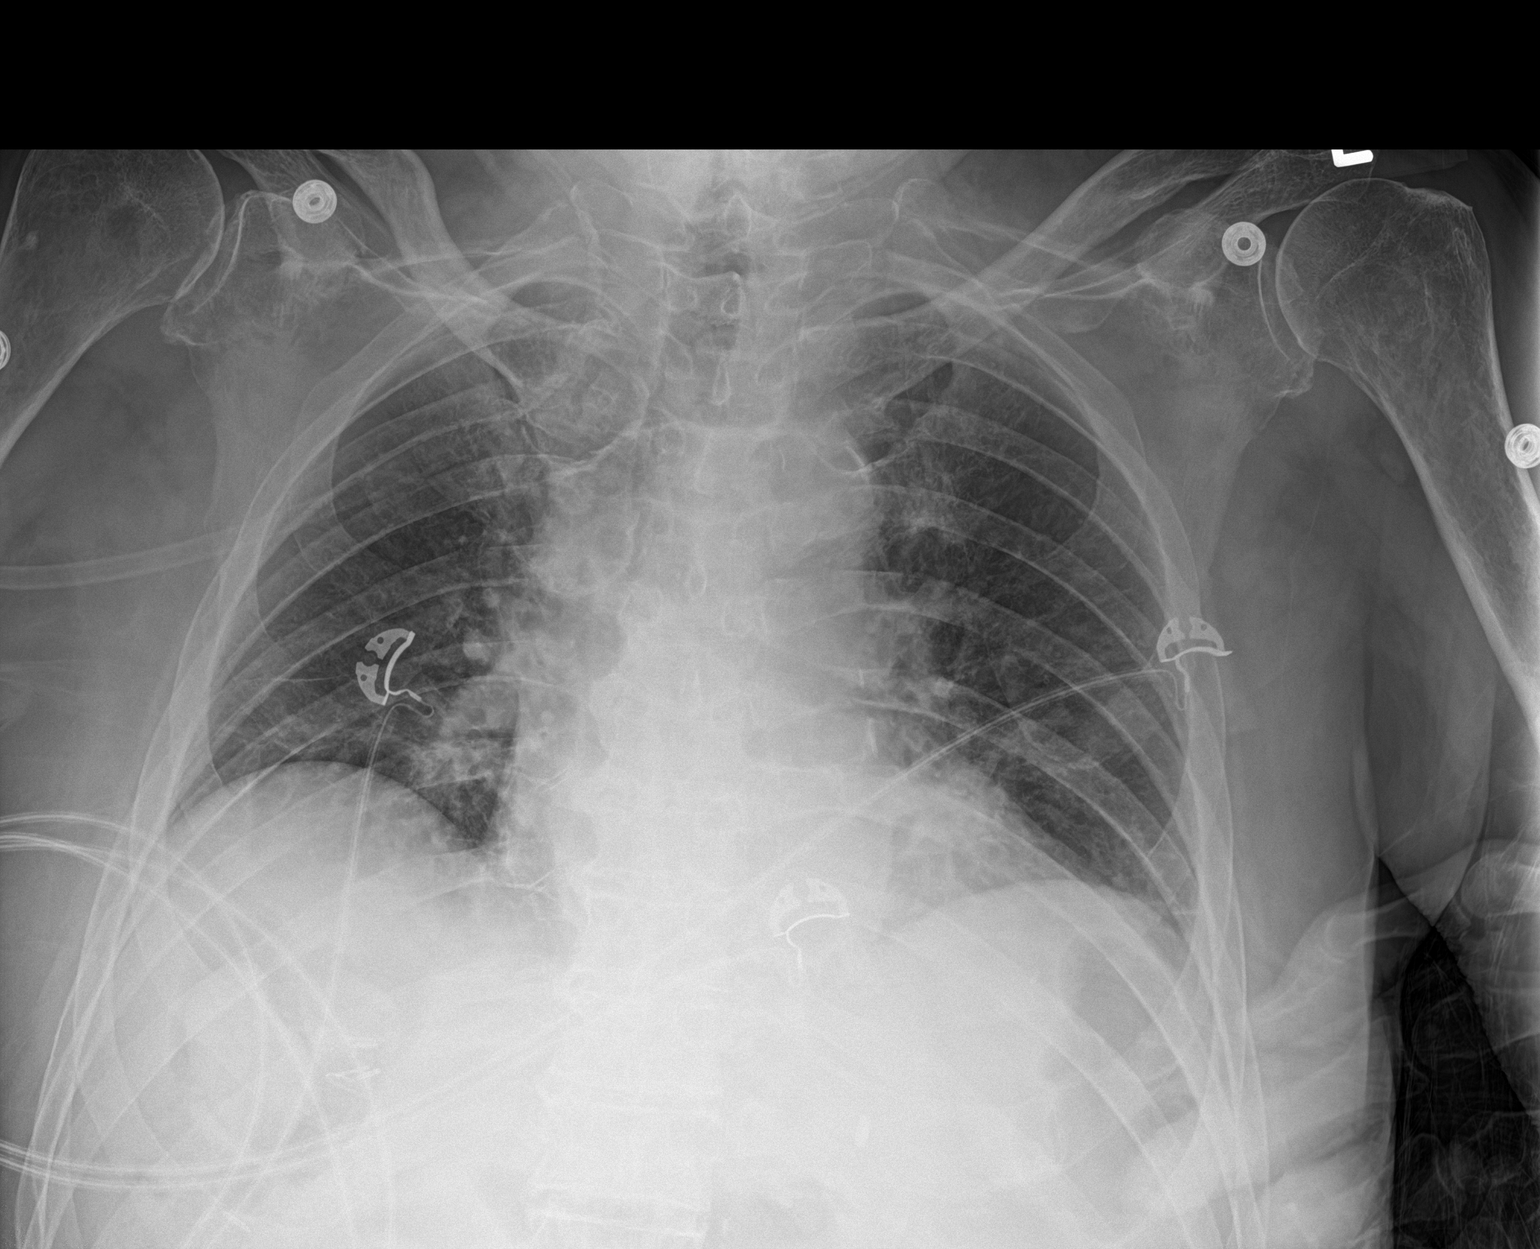

[1 of 1 positions shown; findings below may reference images not displayed]

FINDINGS: The cardiomediastinal silhouette is unremarkable.

This is a mildly low volume film with mild bibasilar atelectasis.

The known LEFT rib fractures are not well visualized on this
radiograph.

No pneumothorax or definite pleural effusion.
IMPRESSION: Mildly low volume film with mild bibasilar atelectasis. Known LEFT
rib fractures not well visualized on this radiograph.

## 2020-06-19 DEATH — deceased
